# Patient Record
Sex: Female | Born: 1990 | Race: Black or African American | Hispanic: No | Marital: Single | Smoking: Never smoker
Health system: Southern US, Community
[De-identification: ages and names within clinical notes are randomized; demographics above are authoritative.]

## PROBLEM LIST (undated history)

## (undated) ENCOUNTER — Inpatient Hospital Stay (HOSPITAL_COMMUNITY): Payer: Self-pay

## (undated) DIAGNOSIS — D229 Melanocytic nevi, unspecified: Secondary | ICD-10-CM

---
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
## 2010-04-02 ENCOUNTER — Ambulatory Visit: Payer: Self-pay | Admitting: Diagnostic Radiology

## 2010-04-02 ENCOUNTER — Emergency Department (HOSPITAL_BASED_OUTPATIENT_CLINIC_OR_DEPARTMENT_OTHER): Admission: EM | Admit: 2010-04-02 | Discharge: 2010-04-02 | Payer: Self-pay | Admitting: Emergency Medicine

## 2011-10-24 ENCOUNTER — Other Ambulatory Visit: Payer: Self-pay

## 2011-10-24 ENCOUNTER — Encounter (HOSPITAL_BASED_OUTPATIENT_CLINIC_OR_DEPARTMENT_OTHER): Payer: Self-pay

## 2011-10-24 ENCOUNTER — Emergency Department (HOSPITAL_BASED_OUTPATIENT_CLINIC_OR_DEPARTMENT_OTHER)
Admission: EM | Admit: 2011-10-24 | Discharge: 2011-10-25 | Discharge status: Against Medical Advice | Payer: BC Managed Care – PPO | Attending: Emergency Medicine | Admitting: Emergency Medicine

## 2011-10-24 DIAGNOSIS — T50905A Adverse effect of unspecified drugs, medicaments and biological substances, initial encounter: Secondary | ICD-10-CM

## 2011-10-24 DIAGNOSIS — F411 Generalized anxiety disorder: Secondary | ICD-10-CM | POA: Insufficient documentation

## 2011-10-24 DIAGNOSIS — R079 Chest pain, unspecified: Secondary | ICD-10-CM | POA: Insufficient documentation

## 2011-10-24 DIAGNOSIS — E876 Hypokalemia: Secondary | ICD-10-CM

## 2011-10-24 DIAGNOSIS — T50995A Adverse effect of other drugs, medicaments and biological substances, initial encounter: Secondary | ICD-10-CM | POA: Insufficient documentation

## 2011-10-24 DIAGNOSIS — F419 Anxiety disorder, unspecified: Secondary | ICD-10-CM

## 2011-10-24 NOTE — ED Notes (Addendum)
Pt c/o CP described as pressure.  Pt very tearful and nervous.  Pt states she has had similar episodes in the past.  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  Pt states she has had anxiety in the past.  Pt states she started using OxyElite Pro recently for weight loss.

## 2011-10-24 NOTE — ED Notes (Signed)
Warm blanket per pts request

## 2011-10-25 ENCOUNTER — Emergency Department (HOSPITAL_BASED_OUTPATIENT_CLINIC_OR_DEPARTMENT_OTHER): Payer: BC Managed Care – PPO

## 2011-10-25 LAB — CBC
HCT: 36 % (ref 36.0–46.0)
MCH: 27.7 pg (ref 26.0–34.0)
MCV: 73.1 fL — ABNORMAL LOW (ref 78.0–100.0)
Platelets: 294 10*3/uL (ref 150–400)
RBC: 4.58 MIL/uL (ref 3.87–5.11)
WBC: 5.2 10*3/uL (ref 4.0–10.5)

## 2011-10-25 LAB — RAPID URINE DRUG SCREEN, HOSP PERFORMED
Amphetamines: NOT DETECTED
Cocaine: NOT DETECTED
Opiates: NOT DETECTED
Tetrahydrocannabinol: NOT DETECTED

## 2011-10-25 LAB — DIFFERENTIAL
Basophils Relative: 1 % (ref 0–1)
Lymphocytes Relative: 60 % — ABNORMAL HIGH (ref 12–46)
Neutro Abs: 1.7 10*3/uL (ref 1.7–7.7)
Neutrophils Relative %: 32 % — ABNORMAL LOW (ref 43–77)

## 2011-10-25 LAB — BASIC METABOLIC PANEL
CO2: 22 mEq/L (ref 19–32)
GFR calc non Af Amer: >90 mL/min (ref >90)
Glucose, Bld: 82 mg/dL (ref 70–99)

## 2011-10-25 MED ORDER — POTASSIUM CHLORIDE CRYS ER 20 MEQ PO TBCR
20.0000 meq | EXTENDED_RELEASE_TABLET | Freq: Once | ORAL | Status: AC
  Administered 2011-10-25: 20 meq via ORAL
  Filled 2011-10-25: qty 1

## 2011-10-25 NOTE — ED Provider Notes (Addendum)
History     CSN: [id number]  Arrival date & time 10/24/11  2212   First MD Initiated Contact with Patient 10/25/11 0000      Chief Complaint  Patient presents with  . Chest Pain    (Consider location/radiation/quality/duration/timing/severity/associated sxs/prior treatment) Patient is a 21 y.o. female presenting with chest pain. The history is provided by the patient. No language interpreter was used.  Chest Pain The chest pain began 6 - 12 hours ago. Chest pain occurs constantly. The chest pain is improving. The pain is associated with stress. At its most intense, the pain is at 10/10. The pain is currently at 10/10. The severity of the pain is severe. The quality of the pain is described as dull. The pain does not radiate. Exacerbated by: nothing. Pertinent negatives for primary symptoms include no fever, no cough, no wheezing, no nausea and no vomiting.  Pertinent negatives for associated symptoms include no claudication, no lower extremity edema, no near-syncope and no weakness. She tried nothing for the symptoms. Risk factors include no known risk factors.  Pertinent negatives for past medical history include no MI.   Patient is not eating and taking an over the counter weight loss supplement called oxyelite Pro.  Is under stress at work and works 2 jobs.  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  No OCP, no long car trips or plane trips.  No pain or swelling in the lower extremities.  PERC negative History and physical performed with tech, [place] present.   Past Medical History  Diagnosis Date  . Anxiety     Past Surgical History  Procedure Date  . Lasik     No family history on file.  History  Substance Use Topics  . Smoking status: Not on file  . Smokeless tobacco: Not on file  . Alcohol Use: Yes     OB History    Grav Para Term Preterm Abortions TAB SAB Ect Mult Living                  Review of Systems  Constitutional: Negative for fever.  HENT: Negative.   Eyes: Negative.   Respiratory: Negative for cough and wheezing.   Cardiovascular: Positive for chest pain. Negative for claudication and near-syncope.  Gastrointestinal: Negative for nausea and vomiting.  Genitourinary: Negative.   Musculoskeletal: Negative.   Neurological: Negative for weakness.  Hematological: Negative.   Psychiatric/Behavioral: Negative.     Allergies  Review of patient's allergies indicates no known allergies.  Home Medications  No current outpatient prescriptions on file.  BP 141/96  Pulse 98  Temp(Src) 98.3 F (36.8 C) (Oral)  Resp 20  Ht 5\' 4"  (1.626 m)  Wt 150 lb (68.04 kg)  BMI 25.75 kg/m2  SpO2 100%  LMP 09/18/2011  Physical Exam  Constitutional: She is oriented to person, place, and time. She appears well-developed and well-nourished.  HENT:  Head: Normocephalic and atraumatic.  Mouth/Throat: Oropharynx is clear and moist.  Eyes: Conjunctivae are normal. Pupils are equal, round, and reactive to light.  Neck: Normal range of motion. Neck supple.  Cardiovascular: Normal rate and regular rhythm.  Exam reveals no gallop and no friction rub.   Pulmonary/Chest: Effort normal and breath sounds normal. She has no wheezes. She has no rales.  Abdominal: Soft. Bowel  sounds are normal. There is no tenderness. There is no rebound and no guarding.  Musculoskeletal: Normal range of motion. She exhibits no edema and no tenderness.  Neurological: She is alert and oriented to person, place, and time. She has normal reflexes.  Skin: Skin is warm and dry. She is not diaphoretic.  Psychiatric: Her mood appears anxious.    ED Course  Procedures (including critical care time)   Labs Reviewed  PREGNANCY, URINE  URINE RAPID DRUG SCREEN (HOSP PERFORMED)  CBC  DIFFERENTIAL  BASIC METABOLIC  PANEL  TROPONIN I   No results found.   No diagnosis found.   Date: 10/25/2011  Rate: 88  Rhythm: sinus arrhythmia  QRS Axis: normal  Intervals: normal  ST/T Wave abnormalities: normal  Conduction Disutrbances:none  Narrative Interpretation:   Old EKG Reviewed: none available   MDM  Patient is very anxious, repeatedly refuses to give urine sample this was witnessed by [facility], tech who was present to assist patient to the restroom. EDP explained urine was necessary to exclude pregnancy prior to chest xrays. Patient was asked repeatedly and does not want chest xray or other intervention interventions. EDP suspect this episode of chest pain is a combination of stress and medication effect.  Patient informed that she should stop taking the diet supplement immediately as there are potentially dangerous side effects. This information was conveyed with patient's nurse Jasmine December present.  Patient verbalized understanding and stated she would discontinue same.      Very low risk for cardiac disease with negative EKG, negative troponin in the setting of continued pain.  PERC negative, symptoms not cw with PE.  very low risk for PE.   Patient refusing chest XRay.  Patient will need to sign our AMA understanding that the risks of signing AMA are but are not limited to death, heart attack, collapsed lung pneumonia, and prolonged morbidity.  Patient has decision making capacity to refuse and is welcomed to return at any time        Kristapher [place] Smitty Cords, MD 10/25/11 [phone]

## 2011-10-25 NOTE — ED Notes (Signed)
States she does not want to see a young doctor.

## 2011-10-25 NOTE — ED Notes (Signed)
Pt does not want to have the cxr that was ordered. Dr Nicanor Alcon spoke to pt in detail about the risk of leaving without having the cxr completed. She understands the risk and wants to go home. States she will stop taking the [facility] diet supplement.

## 2011-11-09 ENCOUNTER — Encounter (HOSPITAL_COMMUNITY): Payer: Self-pay | Admitting: Emergency Medicine

## 2011-11-09 ENCOUNTER — Emergency Department (HOSPITAL_COMMUNITY)
Admission: EM | Admit: 2011-11-09 | Discharge: 2011-11-09 | Disposition: A | Discharge status: Home / Self Care | Payer: No Typology Code available for payment source | Attending: Emergency Medicine | Admitting: Emergency Medicine

## 2011-11-09 ENCOUNTER — Emergency Department (HOSPITAL_COMMUNITY): Payer: No Typology Code available for payment source

## 2011-11-09 DIAGNOSIS — S6000XA Contusion of unspecified finger without damage to nail, initial encounter: Secondary | ICD-10-CM

## 2011-11-09 DIAGNOSIS — Y92410 Unspecified street and highway as the place of occurrence of the external cause: Secondary | ICD-10-CM | POA: Insufficient documentation

## 2011-11-09 DIAGNOSIS — S139XXA Sprain of joints and ligaments of unspecified parts of neck, initial encounter: Secondary | ICD-10-CM | POA: Insufficient documentation

## 2011-11-09 DIAGNOSIS — S161XXA Strain of muscle, fascia and tendon at neck level, initial encounter: Secondary | ICD-10-CM

## 2011-11-09 MED ORDER — CYCLOBENZAPRINE HCL 10 MG PO TABS
10.0000 mg | ORAL_TABLET | Freq: Three times a day (TID) | ORAL | Status: AC | PRN
Start: 2011-11-09 — End: 2011-11-19

## 2011-11-09 MED ORDER — IBUPROFEN 800 MG PO TABS: 800.0000 mg | ORAL_TABLET | Freq: Three times a day (TID) | ORAL | Status: AC | PRN

## 2011-11-09 MED ORDER — HYDROCODONE-ACETAMINOPHEN 5-325 MG PO TABS
1.0000 | ORAL_TABLET | Freq: Once | ORAL | Status: AC
  Administered 2011-11-09: 1 via ORAL
  Filled 2011-11-09: qty 1

## 2011-11-09 MED ORDER — HYDROCODONE-ACETAMINOPHEN 5-325 MG PO TABS: 1.0000 | ORAL_TABLET | Freq: Four times a day (QID) | ORAL | Status: AC | PRN

## 2011-11-09 MED ORDER — IBUPROFEN 800 MG PO TABS
800.0000 mg | ORAL_TABLET | Freq: Once | ORAL | Status: AC
Start: 2011-11-09 — End: 2011-11-09
  Administered 2011-11-09: 800 mg via ORAL
  Filled 2011-11-09: qty 1
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
## 2011-11-09 NOTE — Discharge Instructions (Signed)
Your x-rays were normal here today.  Return here for any worsening in her condition.  Use ice and heat on her neck and back.
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
## 2011-11-10 NOTE — ED Provider Notes (Signed)
Medical screening examination/treatment/procedure(s) were performed by non-physician practitioner and as supervising physician I was immediately available for consultation/collaboration.  Doug Sou, MD 11/10/11 [phone]
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
## 2012-07-16 ENCOUNTER — Ambulatory Visit (INDEPENDENT_AMBULATORY_CARE_PROVIDER_SITE_OTHER): Payer: BC Managed Care – PPO | Admitting: Obstetrics and Gynecology

## 2012-07-16 ENCOUNTER — Encounter: Payer: Self-pay | Admitting: Obstetrics and Gynecology

## 2012-07-16 VITALS — BP 90/62 | Ht 66.0 in | Wt 187.0 lb

## 2012-07-16 DIAGNOSIS — Z130 Encounter for screening for diseases of the blood and blood-forming organs and certain disorders involving the immune mechanism: Secondary | ICD-10-CM

## 2012-07-16 DIAGNOSIS — Z124 Encounter for screening for malignant neoplasm of cervix: Secondary | ICD-10-CM

## 2012-07-16 DIAGNOSIS — Z01419 Encounter for gynecological examination (general) (routine) without abnormal findings: Secondary | ICD-10-CM

## 2012-07-16 NOTE — Progress Notes (Signed)
Subjective:    Caitlin Hall is a 21 y.o. female, G0, who presents for an annual exam.  The patient reports:no complaints   Contraception:no method, natural family planning (NFP)ACOG Pre-Conception and PNV samples given to pt. Pt wants to be tested for SST due to partner with positive SST.   Last mammogram: not applicable    Last pap:2008-[facility]NL  GC/Chlamydia cultures offered: declined Pt had GC/CT last month @ another facility=both negative HIV/RPR/HbsAg offered:  declined HSV 1 and 2 glycoprotein offered: declined  Menstrual cycle regular and monthly: Yes Menstrual flow normal: Yes  Urinary symptoms: none Normal bowel movements: No:  Reports abuse at home: No:       History   Social History  . Marital Status: Single    Spouse Name: N/A    Number of Children: N/A  . Years of Education: N/A   Social History Main Topics  . Smoking status: Never Smoker   . Smokeless tobacco: Never Used  . Alcohol Use: No  . Drug Use: No  . Sexually Active: None   Other Topics Concern  . None   Social History Narrative  . None    Menstrual cycle:   LMP: Patient's last menstrual period was 07/07/2012.           Cycle: every 28-30days  The following portions of the patient's history were reviewed and updated as appropriate: allergies, current medications, past family history, past medical history, past social history, past surgical history and problem list.  Review of Systems Pertinent items are noted in HPI. Breast:Negative for breast lump,nipple discharge or nipple retraction Gastrointestinal: Negative for abdominal pain, change in bowel habits or rectal bleeding Urinary:negative   Objective:    Ht 5\' 6"  (1.676 m)  Wt 187 lb (84.823 kg)  BMI 30.18 kg/m2  LMP 07/07/2012    Weight:  Wt Readings from Last 1 Encounters:  07/16/12 187 lb (84.823 kg)          BMI: Body mass index is 30.18 kg/(m^2).  General Appearance: Alert, appropriate appearance for age. No acute  distress HEENT: Grossly normal Neck / Thyroid: Supple, no masses, nodes or enlargement Lungs: clear to auscultation bilaterally Back: No CVA tenderness Breast Exam: No masses or nodes.No dimpling, nipple retraction or discharge. Cardiovascular: Regular rate and rhythm. S1, S2, no murmur Gastrointestinal: Soft, non-tender, no masses or organomegaly Pelvic Exam: Vulva and vagina appear normal. Bimanual exam reveals normal uterus and adnexa. Rectovaginal: not indicated Lymphatic Exam: Non-palpable nodes in neck, clavicular, axillary, or inguinal regions Skin: no rash or abnormalities Neurologic: Normal gait and speech, no tremor  Psychiatric: Alert and oriented, appropriate affect.     Assessment:    Normal gyn exam    Plan:    pap smear return annually or prn STD screening: declined Contraception:no method Fertility/Conception Discussed PreNatal Vitamins and Folic Acid discussed  Weight Management discussed     Silverio Lay MD

## 2012-07-19 LAB — PAP IG W/ RFLX HPV ASCU

## 2012-07-20 LAB — HEMOGLOBINOPATHY EVALUATION
Hemoglobin Other: 35.2 % — ABNORMAL HIGH
Hgb A2 Quant: 2.6 % (ref 2.2–3.2)

## 2012-07-23 LAB — HGB ELECTROPHORESIS REFLEXED REPORT
Hemoglobin A - HGBRFX: 58.1 % — ABNORMAL LOW (ref >96.0)
Hemoglobin F - HGBRFX: 0 % (ref <2.0)
Sickle Solubility Test - HGBRFX: NEGATIVE

## 2012-07-24 NOTE — Progress Notes (Signed)
Quick Note:  Please call patient and recommend FOB be tested for Sickle cell if his status is unknown. 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

## 2012-07-26 ENCOUNTER — Telehealth: Payer: Self-pay

## 2012-07-26 NOTE — Telephone Encounter (Signed)
Per visit on 07/16/12 pt stated that FOB was + for SST   Darien Ramus, CMA

## 2012-07-26 NOTE — Telephone Encounter (Signed)
Message copied by Darien Ramus on Mon Jul 26, 2012 10:53 AM ------      Message from: Larwance Rote      Created: Mon Jul 26, 2012 10:36 AM                   ----- Message -----         From: Esmeralda Arthur, MD         Sent: 07/24/2012   1:49 PM           To: Butler Denmark, CMA            Please call patient and recommend FOB be tested for Sickle cell if his status is unknown. 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

## 2012-07-27 ENCOUNTER — Encounter: Payer: Self-pay | Admitting: Obstetrics and Gynecology

## 2012-07-27 DIAGNOSIS — D582 Other hemoglobinopathies: Secondary | ICD-10-CM | POA: Insufficient documentation

## 2012-07-27 NOTE — Telephone Encounter (Signed)
Please refer to hematology for genetic counseling: mother is Hgb C heterozygote and FOB is SST

## 2012-07-29 NOTE — Telephone Encounter (Signed)
Faxing records to Dr. Cyndie Chime for genetic counseling. Office states that they have a nurse that does this type of apt.  Pt aware of office contacting her with apt  Darien Ramus, CMA

## 2012-07-30 ENCOUNTER — Telehealth: Payer: Self-pay | Admitting: Genetic Counselor

## 2012-07-30 ENCOUNTER — Telehealth: Payer: Self-pay | Admitting: Obstetrics and Gynecology

## 2012-07-30 NOTE — Telephone Encounter (Signed)
S/W PT IN REF TO NP [facility] ON 10/07/12@10 :00 REFERRING DR Estanislado Pandy MAILED NP APPT.

## 2012-07-30 NOTE — Telephone Encounter (Signed)
TC to pt had questions regarding referral. Advised pt of hemoglobin c heterozygote and reason for referral.  Pt voiced understanding  Darien Ramus, CMA

## 2012-10-07 ENCOUNTER — Ambulatory Visit: Payer: BC Managed Care – PPO | Admitting: Genetic Counselor

## 2012-10-07 ENCOUNTER — Telehealth: Payer: Self-pay | Admitting: Genetic Counselor

## 2012-10-07 ENCOUNTER — Other Ambulatory Visit: Payer: BC Managed Care – PPO | Admitting: Lab

## 2012-10-07 ENCOUNTER — Encounter: Payer: BC Managed Care – PPO | Admitting: Genetic Counselor

## 2012-10-07 DIAGNOSIS — D582 Other hemoglobinopathies: Secondary | ICD-10-CM

## 2012-10-07 NOTE — Telephone Encounter (Signed)
Per Clydie Braun to put her on for 3.

## 2012-10-11 NOTE — Progress Notes (Signed)
Patient was mistakenly scheduled in my clinic to discuss a recent HB electorphoresis.  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  Briefly discussed inheritance patterns and potential outcome for offspring and referred patient and her fiance to Dr. Marlena Clipper.

## 2012-11-24 ENCOUNTER — Encounter: Payer: Self-pay | Admitting: Oncology

## 2012-11-24 ENCOUNTER — Encounter: Payer: BC Managed Care – PPO | Admitting: Oncology

## 2012-11-24 ENCOUNTER — Other Ambulatory Visit: Payer: BC Managed Care – PPO | Admitting: Lab

## 2012-11-24 ENCOUNTER — Ambulatory Visit: Payer: BC Managed Care – PPO

## 2012-11-24 NOTE — Progress Notes (Signed)
Patient and her fianc referred to our office for a discussion about the recent finding of Sickle/C. disease. They failed to report for the visit.

## 2013-03-18 LAB — OB RESULTS CONSOLE ABO/RH: RH Type: POSITIVE

## 2013-03-18 LAB — OB RESULTS CONSOLE GC/CHLAMYDIA
Chlamydia: NEGATIVE
Gonorrhea: NEGATIVE

## 2013-03-18 LAB — OB RESULTS CONSOLE GBS: GBS: POSITIVE

## 2013-03-18 LAB — OB RESULTS CONSOLE ANTIBODY SCREEN: ANTIBODY SCREEN: NEGATIVE

## 2013-03-18 LAB — OB RESULTS CONSOLE RUBELLA ANTIBODY, IGM: Rubella: IMMUNE

## 2013-03-18 LAB — OB RESULTS CONSOLE HIV ANTIBODY (ROUTINE TESTING): HIV: NONREACTIVE

## 2013-03-18 LAB — OB RESULTS CONSOLE RPR: RPR: NONREACTIVE

## 2013-03-18 LAB — OB RESULTS CONSOLE HEPATITIS B SURFACE ANTIGEN: HEP B S AG: NEGATIVE

## 2013-06-01 ENCOUNTER — Encounter (HOSPITAL_COMMUNITY): Payer: Self-pay

## 2013-06-01 ENCOUNTER — Other Ambulatory Visit: Payer: Self-pay

## 2013-06-01 ENCOUNTER — Inpatient Hospital Stay (HOSPITAL_COMMUNITY)
Admission: AD | Admit: 2013-06-01 | Discharge: 2013-06-01 | Disposition: A | Discharge status: Home / Self Care | Payer: BC Managed Care – PPO | Source: Ambulatory Visit | Attending: Obstetrics and Gynecology | Admitting: Obstetrics and Gynecology

## 2013-06-01 DIAGNOSIS — R079 Chest pain, unspecified: Secondary | ICD-10-CM | POA: Insufficient documentation

## 2013-06-01 DIAGNOSIS — O99891 Other specified diseases and conditions complicating pregnancy: Secondary | ICD-10-CM | POA: Diagnosis not present

## 2013-06-01 DIAGNOSIS — M940 Chondrocostal junction syndrome [Tietze]: Secondary | ICD-10-CM | POA: Insufficient documentation

## 2013-06-01 LAB — CBC WITH DIFFERENTIAL/PLATELET
Basophils Relative: 0 % (ref 0–1)
Eosinophils Relative: 2 % (ref 0–5)
HCT: 32 % — ABNORMAL LOW (ref 36.0–46.0)
Hemoglobin: 12.3 g/dL (ref 12.0–15.0)
Lymphocytes Relative: 21 % (ref 12–46)
MCH: 28.8 pg (ref 26.0–34.0)
MCV: 74.9 fL — ABNORMAL LOW (ref 78.0–100.0)
Monocytes Relative: 8 % (ref 3–12)
Neutro Abs: 4.6 10*3/uL (ref 1.7–7.7)
WBC: 6.6 10*3/uL (ref 4.0–10.5)

## 2013-06-01 LAB — COMPREHENSIVE METABOLIC PANEL
ALT: 36 U/L — ABNORMAL HIGH (ref 0–35)
BUN: 6 mg/dL (ref 6–23)
CO2: 22 mEq/L (ref 19–32)
Calcium: 9.5 mg/dL (ref 8.4–10.5)
Creatinine, Ser: 0.54 mg/dL (ref 0.50–1.10)
GFR calc non Af Amer: >90 mL/min (ref >90)
Potassium: 3.4 mEq/L — ABNORMAL LOW (ref 3.5–5.1)
Sodium: 136 mEq/L (ref 135–145)
Total Bilirubin: 0.2 mg/dL — ABNORMAL LOW (ref 0.3–1.2)

## 2013-06-01 MED ORDER — PANTOPRAZOLE SODIUM 20 MG PO TBEC
20.0000 mg | DELAYED_RELEASE_TABLET | Freq: Once | ORAL | Status: AC
Start: 2013-06-01 — End: 2013-06-01
  Administered 2013-06-01: 20 mg via ORAL
  Filled 2013-06-01: qty 1

## 2013-06-01 MED ORDER — ALUM & MAG HYDROXIDE-SIMETH 200-200-20 MG/5ML PO SUSP
30.0000 mL | Freq: Once | ORAL | Status: AC
  Administered 2013-06-01: 30 mL via ORAL
  Filled 2013-06-01: qty 30

## 2013-06-01 NOTE — MAU Provider Note (Signed)
History     CSN: [id number]  Arrival date and time: 06/01/13 1415   First Provider Initiated Contact with Patient 06/01/13 1534      Chief Complaint  Patient presents with  . Chest Pain   Chest Pain     Caitlin Hall is a 22 y.o. G1P0000 at [redacted]w[redacted]d who presents today with chest pain. She states the pain is along the sternum and is worse when she makes a deep inhalation. She has a hx of GERD, however she states that this pain is not the same as the pain she has felt with that. She denies any difficulty breathing.   Past Medical History  Diagnosis Date  . Acid reflux     Past Surgical History  Procedure Laterality Date  . Eye surgery      Family History  Problem Relation Age of Onset  . Other Mother     low estrogen   . Other Sister 45    low estrogen    History  Substance Use Topics  . Smoking status: Never Smoker   . Smokeless tobacco: Never Used  . Alcohol Use: No    Allergies: No Known Allergies  Prescriptions prior to admission  Medication Sig Dispense Refill  . acetaminophen (TYLENOL) 325 MG tablet Take 325 mg by mouth every 6 (six) hours as needed for pain.      . Prenatal Vit-Fe Fumarate-FA (PRENATAL MULTIVITAMIN) TABS tablet Take 1 tablet by mouth daily at 12 noon.        Review of Systems  Cardiovascular: Positive for chest pain.   Physical Exam   Blood pressure 114/74, pulse 100, temperature 98.7 F (37.1 C), temperature source Oral, resp. rate 18, height 5\' 5"  (1.651 m), weight 86.183 kg (190 lb), last menstrual period 07/07/2012, SpO2 100.00%.  Physical Exam  Nursing note and vitals reviewed. Constitutional: She is oriented to person, place, and time. She appears well-developed and well-nourished. No distress.  Cardiovascular: Normal rate, regular rhythm and normal heart sounds.   No murmur heard. Respiratory: Effort normal. She has no wheezes. She exhibits no tenderness.  GI: Soft. There is no tenderness.  Neurological: She is alert  and oriented to person, place, and time.  Skin: Skin is warm and dry.  Psychiatric: She has a normal mood and affect.    MAU Course  Procedures  Results for orders placed during the hospital encounter of 06/01/13 (from the past 24 hour(s))  CBC WITH DIFFERENTIAL     Status: Abnormal   Collection Time    06/01/13  3:13 PM      Result Value Range   WBC 6.6  4.0 - 10.5 K/uL   RBC 4.27  3.87 - 5.11 MIL/uL   Hemoglobin 12.3  12.0 - 15.0 g/dL   HCT 69.6 (*) 29.5 - 28.4 %   MCV 74.9 (*) 78.0 - 100.0 fL   MCH 28.8  26.0 - 34.0 pg   MCHC 38.4 (*) 30.0 - 36.0 g/dL   RDW 13.2  44.0 - 10.2 %   Platelets 257  150 - 400 K/uL   Neutrophils Relative % 69  43 - 77 %   Lymphocytes Relative 21  12 - 46 %   Monocytes Relative 8  3 - 12 %   Eosinophils Relative 2  0 - 5 %   Basophils Relative 0  0 - 1 %   Neutro Abs 4.6  1.7 - 7.7 K/uL   Lymphs Abs 1.4  0.7 - 4.0 K/uL  Monocytes Absolute 0.5  0.1 - 1.0 K/uL   Eosinophils Absolute 0.1  0.0 - 0.7 K/uL   Basophils Absolute 0.0  0.0 - 0.1 K/uL   Smear Review MORPHOLOGY UNREMARKABLE    COMPREHENSIVE METABOLIC PANEL     Status: Abnormal   Collection Time    06/01/13  3:13 PM      Result Value Range   Sodium 136  135 - 145 mEq/L   Potassium 3.4 (*) 3.5 - 5.1 mEq/L   Chloride 102  96 - 112 mEq/L   CO2 22  19 - 32 mEq/L   Glucose, Bld 95  70 - 99 mg/dL   BUN 6  6 - 23 mg/dL   Creatinine, Ser 4.54  0.50 - 1.10 mg/dL   Calcium 9.5  8.4 - 09.8 mg/dL   Total Protein 6.4  6.0 - 8.3 g/dL   Albumin 3.4 (*) 3.5 - 5.2 g/dL   AST 26  0 - 37 U/L   ALT 36 (*) 0 - 35 U/L   Alkaline Phosphatase 43  39 - 117 U/L   Total Bilirubin 0.2 (*) 0.3 - 1.2 mg/dL   GFR calc non Af Amer >90  >90 mL/min   GFR calc Af Amer >90  >90 mL/min    1634: Patient reports that pain is improved.  1640; D/W Dr. Normand Sloop. Patient is ok for DC home. Will plan to alternate tylenol and ibuprofen for 24 hours, and call office if no improvement in 48 hours.   Assessment and Plan    1. Costochondritis    Alternate tylenol and ibuprofen for the next 24 hours  Call the office if pain is not improved in 48 hours   Tawnya Crook 06/01/2013, 3:57 PM

## 2013-06-01 NOTE — Progress Notes (Signed)
Heather hogan cnm notified that dr Normand Sloop wants a advanced practice provider to evaluate patient.

## 2013-06-01 NOTE — MAU Note (Signed)
Sharp pain in chest, started about an hour ago- worse when inhales, epigastric area; "feels like it was going to cave in", thought bra might be too tight, loosened it - no relief.  Had some chest pain before- took antacid and it went away.

## 2013-06-01 NOTE — Progress Notes (Signed)
Dr Normand Sloop notified of patient, her presenting complains, vital signs. Order to have a MAU advanced practice provider see patient, order cbc with diff, cmet, patient may have protonix 20mg  once if she have not taken it today and maalox liquid 30ml once.

## 2013-06-07 ENCOUNTER — Other Ambulatory Visit: Payer: Self-pay | Admitting: Obstetrics and Gynecology

## 2013-06-07 DIAGNOSIS — O283 Abnormal ultrasonic finding on antenatal screening of mother: Secondary | ICD-10-CM

## 2013-06-08 ENCOUNTER — Encounter (HOSPITAL_COMMUNITY): Payer: Self-pay | Admitting: Obstetrics and Gynecology

## 2013-06-21 ENCOUNTER — Other Ambulatory Visit (HOSPITAL_COMMUNITY): Payer: BC Managed Care – PPO

## 2013-06-21 ENCOUNTER — Ambulatory Visit (HOSPITAL_COMMUNITY): Payer: No Typology Code available for payment source

## 2013-06-21 ENCOUNTER — Ambulatory Visit (HOSPITAL_COMMUNITY): Payer: BC Managed Care – PPO

## 2013-06-21 ENCOUNTER — Encounter (HOSPITAL_COMMUNITY): Payer: BC Managed Care – PPO

## 2013-06-24 ENCOUNTER — Ambulatory Visit (HOSPITAL_COMMUNITY)
Admission: RE | Admit: 2013-06-24 | Discharge: 2013-06-24 | Disposition: A | Discharge status: Home / Self Care | Payer: BC Managed Care – PPO | Source: Ambulatory Visit | Attending: Obstetrics and Gynecology | Admitting: Obstetrics and Gynecology

## 2013-06-24 ENCOUNTER — Encounter (HOSPITAL_COMMUNITY): Payer: Self-pay

## 2013-06-24 DIAGNOSIS — O3410 Maternal care for benign tumor of corpus uteri, unspecified trimester: Secondary | ICD-10-CM | POA: Insufficient documentation

## 2013-06-24 DIAGNOSIS — O358XX0 Maternal care for other (suspected) fetal abnormality and damage, not applicable or unspecified: Secondary | ICD-10-CM | POA: Insufficient documentation

## 2013-06-24 DIAGNOSIS — O352XX0 Maternal care for (suspected) hereditary disease in fetus, not applicable or unspecified: Secondary | ICD-10-CM | POA: Insufficient documentation

## 2013-06-24 DIAGNOSIS — Z1389 Encounter for screening for other disorder: Secondary | ICD-10-CM | POA: Insufficient documentation

## 2013-06-24 DIAGNOSIS — O283 Abnormal ultrasonic finding on antenatal screening of mother: Secondary | ICD-10-CM

## 2013-06-24 DIAGNOSIS — Z363 Encounter for antenatal screening for malformations: Secondary | ICD-10-CM | POA: Insufficient documentation

## 2013-07-14 ENCOUNTER — Other Ambulatory Visit: Payer: Self-pay

## 2013-08-25 ENCOUNTER — Encounter (HOSPITAL_COMMUNITY): Payer: Self-pay | Admitting: General Practice

## 2013-08-25 ENCOUNTER — Inpatient Hospital Stay (HOSPITAL_COMMUNITY)
Admission: AD | Admit: 2013-08-25 | Discharge: 2013-08-26 | Disposition: A | Discharge status: Home / Self Care | Payer: Medicaid Other | Source: Ambulatory Visit | Attending: Obstetrics and Gynecology | Admitting: Obstetrics and Gynecology

## 2013-08-25 DIAGNOSIS — O4700 False labor before 37 completed weeks of gestation, unspecified trimester: Secondary | ICD-10-CM | POA: Insufficient documentation

## 2013-08-25 LAB — URINE MICROSCOPIC-ADD ON

## 2013-08-25 LAB — URINALYSIS, ROUTINE W REFLEX MICROSCOPIC
Bilirubin Urine: NEGATIVE
Glucose, UA: NEGATIVE mg/dL
Ketones, ur: NEGATIVE mg/dL
LEUKOCYTES UA: NEGATIVE
NITRITE: NEGATIVE
PH: 6.5 (ref 5.0–8.0)
Protein, ur: NEGATIVE mg/dL
SPECIFIC GRAVITY, URINE: 1.02 (ref 1.005–1.030)
Urobilinogen, UA: 1 mg/dL (ref 0.0–1.0)

## 2013-08-25 NOTE — MAU Provider Note (Signed)
History   22yo, G1P0 at [redacted]w[redacted]d presents for UCs every 30-45 min.  Described as starting in her back and wrapping around to her lower abdomen.  Denies VB, LOF, recent fever, resp or GI c/o's, UTI or PIH s/s. GFM.   Chief Complaint  Patient presents with  . Contractions   HPI  OB History   Grav Para Term Preterm Abortions TAB SAB Ect Mult Living   1 0 0 0 0 0 0 0 0 0       Past Medical History  Diagnosis Date  . Acid reflux     Past Surgical History  Procedure Laterality Date  . Eye surgery      Family History  Problem Relation Age of Onset  . Other Mother     low estrogen   . Other Sister 55    low estrogen    History  Substance Use Topics  . Smoking status: Never Smoker   . Smokeless tobacco: Never Used  . Alcohol Use: No    Allergies: No Known Allergies  Prescriptions prior to admission  Medication Sig Dispense Refill  . acetaminophen (TYLENOL) 325 MG tablet Take 325 mg by mouth every 6 (six) hours as needed for pain.      . Prenatal Vit-Fe Fumarate-FA (PRENATAL MULTIVITAMIN) TABS tablet Take 1 tablet by mouth daily at 12 noon.        ROS ROS: see HPI above, all other systems are negative  Physical Exam   Blood pressure 142/84, pulse 126, temperature 98.4 F (36.9 C), temperature source Oral, resp. rate 18, height 5\' 6"  (1.676 m), weight 204 lb (92.534 kg), last menstrual period 01/25/2013.  Physical Exam Chest: Clear Heart: RRR Abdomen: gravid, NT Extremities: WNL  Dilation: Closed Effacement (%): Thick Cervical Position: Middle Station: Ballotable  FHT: Reactive NST UCs: rare with some uterine irritability  Results for orders placed during the hospital encounter of 08/25/13 (from the past 24 hour(s))  URINALYSIS, ROUTINE W REFLEX MICROSCOPIC     Status: Abnormal   Collection Time    08/25/13 10:15 PM      Result Value Range   Color, Urine YELLOW  YELLOW   APPearance CLEAR  CLEAR   Specific Gravity, Urine 1.020  1.005 - 1.030   pH 6.5  5.0  - 8.0   Glucose, UA NEGATIVE  NEGATIVE mg/dL   Hgb urine dipstick SMALL (*) NEGATIVE   Bilirubin Urine NEGATIVE  NEGATIVE   Ketones, ur NEGATIVE  NEGATIVE mg/dL   Protein, ur NEGATIVE  NEGATIVE mg/dL   Urobilinogen, UA 1.0  0.0 - 1.0 mg/dL   Nitrite NEGATIVE  NEGATIVE   Leukocytes, UA NEGATIVE  NEGATIVE  URINE MICROSCOPIC-ADD ON     Status: None   Collection Time    08/25/13 10:15 PM      Result Value Range   Squamous Epithelial / LPF RARE  RARE   WBC, UA 0-2  <3 WBC/hpf   RBC / HPF 0-2  <3 RBC/hpf   Bacteria, UA RARE  RARE  WET PREP, GENITAL     Status: Abnormal   Collection Time    08/26/13 12:10 AM      Result Value Range   Yeast Wet Prep HPF POC NONE SEEN  NONE SEEN   Trich, Wet Prep NONE SEEN  NONE SEEN   Clue Cells Wet Prep HPF POC NONE SEEN  NONE SEEN   WBC, Wet Prep HPF POC FEW (*) NONE SEEN  FETAL FIBRONECTIN     Status:  None   Collection Time    08/26/13 12:10 AM      Result Value Range   Fetal Fibronectin NEGATIVE  NEGATIVE     ED Course  IUP at [redacted]w[redacted]d PTL evaluation  Wet Prep FFN GC/CT  No cervical change D/c home with precautions F/u 2/3 with an already scheduled ROB   Linda Hedges CNM, MSN 08/25/2013 11:50 PM

## 2013-08-25 NOTE — MAU Note (Signed)
Pt presents to MAU with c/o feeling uterine contractions once every 30 to 45 minutes.

## 2013-08-25 NOTE — MAU Note (Signed)
Pt G1 at 30.2wks having contractions every 87min. Denies bleeding, leaking or discharge. No problems with pregnancy.

## 2013-08-26 LAB — GC/CHLAMYDIA PROBE AMP
CT PROBE, AMP APTIMA: NEGATIVE
GC PROBE AMP APTIMA: NEGATIVE

## 2013-08-26 LAB — FETAL FIBRONECTIN: Fetal Fibronectin: NEGATIVE

## 2013-08-26 LAB — WET PREP, GENITAL
CLUE CELLS WET PREP: NONE SEEN
Trich, Wet Prep: NONE SEEN
Yeast Wet Prep HPF POC: NONE SEEN

## 2013-08-26 NOTE — Discharge Instructions (Signed)
Preterm Labor Information Preterm labor is when labor starts at less than 37 weeks of pregnancy. The normal length of a pregnancy is 39 to 41 weeks. CAUSES Often, there is no identifiable underlying cause as to why a woman goes into preterm labor. One of the most common known causes of preterm labor is infection. Infections of the uterus, cervix, vagina, amniotic sac, bladder, kidney, or even the lungs (pneumonia) can cause labor to start. Other suspected causes of preterm labor include:   Urogenital infections, such as yeast infections and bacterial vaginosis.   Uterine abnormalities (uterine shape, uterine septum, fibroids, or bleeding from the placenta).   A cervix that has been operated on (it may fail to stay closed).   Malformations in the fetus.   Multiple gestations (twins, triplets, and so on).   Breakage of the amniotic sac.  RISK FACTORS  Having a previous history of preterm labor.   Having premature rupture of membranes (PROM).   Having a placenta that covers the opening of the cervix (placenta previa).   Having a placenta that separates from the uterus (placental abruption).   Having a cervix that is too weak to hold the fetus in the uterus (incompetent cervix).   Having too much fluid in the amniotic sac (polyhydramnios).   Taking illegal drugs or smoking while pregnant.   Not gaining enough weight while pregnant.   Being younger than 69 and older than 25 years old.   Having a low socioeconomic status.   Being African American. SYMPTOMS Signs and symptoms of preterm labor include:   Menstrual-like cramps, abdominal pain, or back pain.  Uterine contractions that are regular, as frequent as six in an hour, regardless of their intensity (may be mild or painful).  Contractions that start on the top of the uterus and spread down to the lower abdomen and back.   A sense of increased pelvic pressure.   A watery or bloody mucus discharge that  comes from the vagina.  TREATMENT Depending on the length of the pregnancy and other circumstances, your health care provider may suggest bed rest. If necessary, there are medicines that can be given to stop contractions and to mature the fetal lungs. If labor happens before 34 weeks of pregnancy, a prolonged hospital stay may be recommended. Treatment depends on the condition of both you and the fetus.  WHAT SHOULD YOU DO IF YOU THINK YOU ARE IN PRETERM LABOR? Call your health care provider right away. You will need to go to the hospital to get checked immediately. HOW CAN YOU PREVENT PRETERM LABOR IN FUTURE PREGNANCIES? You should:   Stop smoking if you smoke.  Maintain healthy weight gain and avoid chemicals and drugs that are not necessary.  Be watchful for any type of infection.  Inform your health care provider if you have a known history of preterm labor. Document Released: 10/11/2003 Document Revised: 03/23/2013 Document Reviewed: 08/23/2012 [facility] Patient Information 2014 [place], [place].  Braxton Hicks Contractions Pregnancy is commonly associated with contractions of the uterus throughout the pregnancy. Towards the end of pregnancy (32 to 34 weeks), these contractions [facility] Ishmael Holter) can develop more often and may become more forceful. This is not true labor because these contractions do not result in opening (dilatation) and thinning of the cervix. They are sometimes difficult to tell apart from true labor because these contractions can be forceful and people have different pain tolerances. You should not feel embarrassed if you go to the hospital with false labor. Sometimes,  the only way to tell if you are in true labor is for your caregiver to follow the changes in the cervix. How to tell the difference between true and false labor:  False labor.  The contractions of false labor are usually shorter, irregular and not as hard as those of true labor.  They are often felt in  the front of the lower abdomen and in the groin.  They may leave with walking around or changing positions while lying down.  They get weaker and are shorter lasting as time goes on.  These contractions are usually irregular.  They do not usually become progressively stronger, regular and closer together as with true labor.  True labor.  Contractions in true labor last 30 to 70 seconds, become very regular, usually become more intense, and increase in frequency.  They do not go away with walking.  The discomfort is usually felt in the top of the uterus and spreads to the lower abdomen and low back.  True labor can be determined by your caregiver with an exam. This will show that the cervix is dilating and getting thinner. If there are no prenatal problems or other health problems associated with the pregnancy, it is completely safe to be sent home with false labor and await the onset of true labor. HOME CARE INSTRUCTIONS   Keep up with your usual exercises and instructions.  Take medications as directed.  Keep your regular prenatal appointment.  Eat and drink lightly if you think you are going into labor.  If BH contractions are making you uncomfortable:  Change your activity position from lying down or resting to walking/walking to resting.  Sit and rest in a tub of warm water.  Drink 2 to 3 glasses of water. Dehydration may cause B-H contractions.  Do slow and deep breathing several times an hour. SEEK IMMEDIATE MEDICAL CARE IF:   Your contractions continue to become stronger, more regular, and closer together.  You have a gushing, burst or leaking of fluid from the vagina.  An oral temperature above 102 F (38.9 C) develops.  You have passage of blood-tinged mucus.  You develop vaginal bleeding.  You develop continuous belly (abdominal) pain.  You have low back pain that you never had before.  You feel the baby's head pushing down causing pelvic  pressure.  The baby is not moving as much as it used to. Document Released: 07/21/2005 Document Revised: 10/13/2011 Document Reviewed: 05/02/2013 [facility] Patient Information 2014 [place], [place].

## 2013-10-19 ENCOUNTER — Encounter (HOSPITAL_COMMUNITY): Payer: Self-pay | Admitting: *Deleted

## 2013-10-19 ENCOUNTER — Inpatient Hospital Stay (HOSPITAL_COMMUNITY)
Admission: AD | Admit: 2013-10-19 | Discharge: 2013-10-19 | Disposition: A | Discharge status: Home / Self Care | Payer: Medicaid Other | Source: Ambulatory Visit | Attending: Obstetrics and Gynecology | Admitting: Obstetrics and Gynecology

## 2013-10-19 DIAGNOSIS — O479 False labor, unspecified: Secondary | ICD-10-CM | POA: Insufficient documentation

## 2013-10-19 NOTE — MAU Note (Signed)
C/o ucs since 1030 today; ucs have gotten progressively stronger;

## 2013-10-19 NOTE — MAU Provider Note (Signed)
History   22yo, G1P0 at [redacted]w[redacted]d presents to MAU with c/o UCs that started about 1030 and have become progressively worse throughout the day.  Denies VB, LOF, recent fever, resp or GI c/o's, UTI or PIH s/s. GFM.  Chief Complaint  Patient presents with  . Labor Eval   HPI  OB History   Grav Para Term Preterm Abortions TAB SAB Ect Mult Living   1 0 0 0 0 0 0 0 0 0       Past Medical History  Diagnosis Date  . Acid reflux     Past Surgical History  Procedure Laterality Date  . Eye surgery      Family History  Problem Relation Age of Onset  . Other Mother     low estrogen   . Other Sister 38    low estrogen    History  Substance Use Topics  . Smoking status: Never Smoker   . Smokeless tobacco: Never Used  . Alcohol Use: No    Allergies: No Known Allergies  Prescriptions prior to admission  Medication Sig Dispense Refill  . acetaminophen (TYLENOL) 500 MG tablet Take 500 mg by mouth every 6 (six) hours as needed for mild pain or headache.      . Prenatal Vit-Fe Fumarate-FA (PRENATAL MULTIVITAMIN) TABS tablet Take 1 tablet by mouth daily at 12 noon.        ROS ROS: see HPI above, all other systems are negative  Physical Exam   Blood pressure 126/80, pulse 125, temperature 98.6 F (37 C), temperature source Oral, resp. rate 18, height 5\' 6"  (1.676 m), weight 95.709 kg (211 lb), last menstrual period 01/25/2013.  Physical Exam Chest: Clear Heart: RRR Abdomen: gravid, NT Extremities: WNL  Dilation: 1 Effacement (%): 70;60 Cervical Position: Middle Station: -2 Exam by:: Devoria Glassing RN@1900   Dilation: Fingertip Effacement (%): 50 Cervical Position: Anterior Station: -2 Exam by:: J. Shreya Lacasse, CNM@2015   FHT: Reactive NST UCs: Irregular  ED Course  IUP at [redacted]w[redacted]d Labor check  No cervical change  D/c home with precautions and comfort measures F/u tomorrow with an already scheduled ROB    Linda Hedges CNM, MSN 10/19/2013 7:19 PM

## 2013-10-19 NOTE — Discharge Instructions (Signed)

## 2013-10-29 ENCOUNTER — Inpatient Hospital Stay (HOSPITAL_COMMUNITY)
Admission: AD | Admit: 2013-10-29 | Discharge: 2013-10-29 | Disposition: A | Discharge status: Home / Self Care | Payer: Medicaid Other | Source: Ambulatory Visit | Attending: Obstetrics and Gynecology | Admitting: Obstetrics and Gynecology

## 2013-10-29 ENCOUNTER — Encounter (HOSPITAL_COMMUNITY): Payer: Self-pay | Admitting: *Deleted

## 2013-10-29 DIAGNOSIS — O9989 Other specified diseases and conditions complicating pregnancy, childbirth and the puerperium: Principal | ICD-10-CM

## 2013-10-29 DIAGNOSIS — O99891 Other specified diseases and conditions complicating pregnancy: Secondary | ICD-10-CM | POA: Insufficient documentation

## 2013-10-29 LAB — AMNISURE RUPTURE OF MEMBRANE (ROM) NOT AT ARMC: Amnisure ROM: NEGATIVE

## 2013-10-29 NOTE — Discharge Instructions (Signed)
Braxton Hicks Contractions Pregnancy is commonly associated with contractions of the uterus throughout the pregnancy. Towards the end of pregnancy (32 to 34 weeks), these contractions (Braxton Hicks) can develop more often and may become more forceful. This is not true labor because these contractions do not result in opening (dilatation) and thinning of the cervix. They are sometimes difficult to tell apart from true labor because these contractions can be forceful and people have different pain tolerances. You should not feel embarrassed if you go to the hospital with false labor. Sometimes, the only way to tell if you are in true labor is for your caregiver to follow the changes in the cervix. How to tell the difference between true and false labor:  False labor.  The contractions of false labor are usually shorter, irregular and not as hard as those of true labor.  They are often felt in the front of the lower abdomen and in the groin.  They may leave with walking around or changing positions while lying down.  They get weaker and are shorter lasting as time goes on.  These contractions are usually irregular.  They do not usually become progressively stronger, regular and closer together as with true labor.  True labor.  Contractions in true labor last 30 to 70 seconds, become very regular, usually become more intense, and increase in frequency.  They do not go away with walking.  The discomfort is usually felt in the top of the uterus and spreads to the lower abdomen and low back.  True labor can be determined by your caregiver with an exam. This will show that the cervix is dilating and getting thinner. If there are no prenatal problems or other health problems associated with the pregnancy, it is completely safe to be sent home with false labor and await the onset of true labor. HOME CARE INSTRUCTIONS   Keep up with your usual exercises and instructions.  Take medications as  directed.  Keep your regular prenatal appointment.  Eat and drink lightly if you think you are going into labor.  If BH contractions are making you uncomfortable:  Change your activity position from lying down or resting to walking/walking to resting.  Sit and rest in a tub of warm water.  Drink 2 to 3 glasses of water. Dehydration may cause B-H contractions.  Do slow and deep breathing several times an hour. SEEK IMMEDIATE MEDICAL CARE IF:   Your contractions continue to become stronger, more regular, and closer together.  You have a gushing, burst or leaking of fluid from the vagina.  An oral temperature above 102 F (38.9 C) develops.  You have passage of blood-tinged mucus.  You develop vaginal bleeding.  You develop continuous belly (abdominal) pain.  You have low back pain that you never had before.  You feel the baby's head pushing down causing pelvic pressure.  The baby is not moving as much as it used to. Document Released: 07/21/2005 Document Revised: 10/13/2011 Document Reviewed: 05/02/2013 [employer] Patient Information 2014 [employer].  Fetal Movement Counts Patient Name: __________________________________________________ Patient Due Date: ____________________ Performing a fetal movement count is highly recommended in high-risk pregnancies, but it is good for every pregnant woman to do. Your caregiver may ask you to start counting fetal movements at 28 weeks of the pregnancy. Fetal movements often increase:  After eating a full meal.  After physical activity.  After eating or drinking something sweet or cold.  At rest. Pay attention to when you feel   the baby is most active. This will help you notice a pattern of your baby's sleep and wake cycles and what factors contribute to an increase in fetal movement. It is important to perform a fetal movement count at the same time each day when your baby is normally most active.  HOW TO COUNT FETAL  MOVEMENTS 1. Find a quiet and comfortable area to sit or lie down on your left side. Lying on your left side provides the best blood and oxygen circulation to your baby. 2. Write down the day and time on a sheet of paper or in a journal. 3. Start counting kicks, flutters, swishes, rolls, or jabs in a 2 hour period. You should feel at least 10 movements within 2 hours. 4. If you do not feel 10 movements in 2 hours, wait 2 3 hours and count again. Look for a change in the pattern or not enough counts in 2 hours. SEEK MEDICAL CARE IF:  You feel less than 10 counts in 2 hours, tried twice.  There is no movement in over an hour.  The pattern is changing or taking longer each day to reach 10 counts in 2 hours.  You feel the baby is not moving as he or she usually does. Date: ____________ Movements: ____________ Start time: ____________ Finish time: ____________  Date: ____________ Movements: ____________ Start time: ____________ Finish time: ____________ Date: ____________ Movements: ____________ Start time: ____________ Finish time: ____________ Date: ____________ Movements: ____________ Start time: ____________ Finish time: ____________ Date: ____________ Movements: ____________ Start time: ____________ Finish time: ____________ Date: ____________ Movements: ____________ Start time: ____________ Finish time: ____________ Date: ____________ Movements: ____________ Start time: ____________ Finish time: ____________ Date: ____________ Movements: ____________ Start time: ____________ Finish time: ____________  Date: ____________ Movements: ____________ Start time: ____________ Finish time: ____________ Date: ____________ Movements: ____________ Start time: ____________ Finish time: ____________ Date: ____________ Movements: ____________ Start time: ____________ Finish time: ____________ Date: ____________ Movements: ____________ Start time: ____________ Finish time: ____________ Date: ____________  Movements: ____________ Start time: ____________ Finish time: ____________ Date: ____________ Movements: ____________ Start time: ____________ Finish time: ____________ Date: ____________ Movements: ____________ Start time: ____________ Finish time: ____________  Date: ____________ Movements: ____________ Start time: ____________ Finish time: ____________ Date: ____________ Movements: ____________ Start time: ____________ Finish time: ____________ Date: ____________ Movements: ____________ Start time: ____________ Finish time: ____________ Date: ____________ Movements: ____________ Start time: ____________ Finish time: ____________ Date: ____________ Movements: ____________ Start time: ____________ Finish time: ____________ Date: ____________ Movements: ____________ Start time: ____________ Finish time: ____________ Date: ____________ Movements: ____________ Start time: ____________ Finish time: ____________  Date: ____________ Movements: ____________ Start time: ____________ Finish time: ____________ Date: ____________ Movements: ____________ Start time: ____________ Finish time: ____________ Date: ____________ Movements: ____________ Start time: ____________ Finish time: ____________ Date: ____________ Movements: ____________ Start time: ____________ Finish time: ____________ Date: ____________ Movements: ____________ Start time: ____________ Finish time: ____________ Date: ____________ Movements: ____________ Start time: ____________ Finish time: ____________ Date: ____________ Movements: ____________ Start time: ____________ Finish time: ____________  Date: ____________ Movements: ____________ Start time: ____________ Finish time: ____________ Date: ____________ Movements: ____________ Start time: ____________ Finish time: ____________ Date: ____________ Movements: ____________ Start time: ____________ Finish time: ____________ Date: ____________ Movements: ____________ Start time:  ____________ Finish time: ____________ Date: ____________ Movements: ____________ Start time: ____________ Finish time: ____________ Date: ____________ Movements: ____________ Start time: ____________ Finish time: ____________ Date: ____________ Movements: ____________ Start time: ____________ Finish time: ____________  Date: ____________ Movements: ____________ Start time: ____________ Finish time: ____________ Date: ____________ Movements: ____________ Start   time: ____________ Finish time: ____________ Date: ____________ Movements: ____________ Start time: ____________ Finish time: ____________ Date: ____________ Movements: ____________ Start time: ____________ Finish time: ____________ Date: ____________ Movements: ____________ Start time: ____________ Finish time: ____________ Date: ____________ Movements: ____________ Start time: ____________ Finish time: ____________ Date: ____________ Movements: ____________ Start time: ____________ Finish time: ____________  Date: ____________ Movements: ____________ Start time: ____________ Finish time: ____________ Date: ____________ Movements: ____________ Start time: ____________ Finish time: ____________ Date: ____________ Movements: ____________ Start time: ____________ Finish time: ____________ Date: ____________ Movements: ____________ Start time: ____________ Finish time: ____________ Date: ____________ Movements: ____________ Start time: ____________ Finish time: ____________ Date: ____________ Movements: ____________ Start time: ____________ Finish time: ____________ Date: ____________ Movements: ____________ Start time: ____________ Finish time: ____________  Date: ____________ Movements: ____________ Start time: ____________ Finish time: ____________ Date: ____________ Movements: ____________ Start time: ____________ Finish time: ____________ Date: ____________ Movements: ____________ Start time: ____________ Finish time: ____________ Date:  ____________ Movements: ____________ Start time: ____________ Finish time: ____________ Date: ____________ Movements: ____________ Start time: ____________ Finish time: ____________ Date: ____________ Movements: ____________ Start time: ____________ Finish time: ____________ Document Released: 08/20/2006 Document Revised: 07/07/2012 Document Reviewed: 05/17/2012 [employer] Patient Information 2014 [employer].  

## 2013-10-29 NOTE — MAU Provider Note (Signed)
  History     CSN: [id number]  Arrival date and time: 10/29/13 2005   None     Chief Complaint  Patient presents with  . Labor Eval   HPI Comments: Pt is a G1P0 at [place] arrives unannounced c/o ?LOF, states it happened yesterday w saturation of underpants, and leaked a little bit more today. Denies any VB, reports occ ctx. Not wearing any pad currently, and no fluid on chux pad.      Past Medical History  Diagnosis Date  . Acid reflux     Past Surgical History  Procedure Laterality Date  . Eye surgery    . Eye surgery      at age 23 years old    Family History  Problem Relation Age of Onset  . Other Mother     low estrogen   . Other Sister 79    low estrogen    History  Substance Use Topics  . Smoking status: Never Smoker   . Smokeless tobacco: Never Used  . Alcohol Use: No    Allergies: No Known Allergies  Prescriptions prior to admission  Medication Sig Dispense Refill  . acetaminophen (TYLENOL) 500 MG tablet Take 500 mg by mouth every 6 (six) hours as needed for mild pain or headache.      Marland Kitchen EVENING PRIMROSE OIL PO Take 1 capsule by mouth 3 (three) times daily.      . Prenatal Vit-Fe Fumarate-FA (PRENATAL MULTIVITAMIN) TABS tablet Take 1 tablet by mouth daily at 12 noon.        Review of Systems  All other systems reviewed and are negative.   Physical Exam   Blood pressure 136/84, pulse 85, temperature 98.9 F (37.2 C), temperature source Oral, resp. rate 18, last menstrual period 01/25/2013.  Physical Exam  Nursing note and vitals reviewed. Constitutional: She is oriented to person, place, and time. She appears well-developed and well-nourished.  Cardiovascular: Normal rate.   Respiratory: Effort normal.  GI: Soft.  Genitourinary: Vagina normal. No vaginal discharge found.  Musculoskeletal: Normal range of motion.  Neurological: She is alert and oriented to person, place, and time. She has normal reflexes.  Skin: Skin is warm and dry.   Psychiatric: She has a normal mood and affect. Her behavior is normal.    MAU Course  Procedures    Assessment and Plan  IUP at 40wks amnisure neg Cervix 1cm FHR cat 1 w rare ctx  Dc home, keep scheduled f/u  rv'd [place] and labor   Keyion Knack M 10/29/2013, 10:57 PM

## 2013-10-29 NOTE — MAU Note (Signed)
?   Water starting leaking at 3:30 pm.  Clear, reports baby moving well.  Denies bleeding.

## 2013-10-29 NOTE — MAU Note (Signed)
PT   SAYS  SHE THINKS SROM YESTERDAY  AT [place] [facility]- AND HAS CONTINUED  TO  LEAK.-  NO PAD ON IN TRIAGE -  SAYS  HER PERINEUM IS DRY  BECAUSE SHE TOOK SHOWER AT HOME.    VE IN  OFFICE  1 CM.  DENIES HSV AND MRSA.   DID NOT CALL DR.

## 2013-11-07 ENCOUNTER — Telehealth (HOSPITAL_COMMUNITY): Payer: Self-pay | Admitting: *Deleted

## 2013-11-11 ENCOUNTER — Encounter (HOSPITAL_COMMUNITY): Payer: Self-pay

## 2013-11-11 ENCOUNTER — Inpatient Hospital Stay (HOSPITAL_COMMUNITY)
Admission: RE | Admit: 2013-11-11 | Discharge: 2013-11-16 | DRG: 766 | Disposition: A | Discharge status: Home / Self Care | Payer: Medicaid Other | Source: Ambulatory Visit | Attending: Obstetrics and Gynecology | Admitting: Obstetrics and Gynecology

## 2013-11-11 DIAGNOSIS — O9989 Other specified diseases and conditions complicating pregnancy, childbirth and the puerperium: Secondary | ICD-10-CM

## 2013-11-11 DIAGNOSIS — O34599 Maternal care for other abnormalities of gravid uterus, unspecified trimester: Secondary | ICD-10-CM | POA: Diagnosis present

## 2013-11-11 DIAGNOSIS — O480 Post-term pregnancy: Principal | ICD-10-CM | POA: Diagnosis present

## 2013-11-11 DIAGNOSIS — O3660X0 Maternal care for excessive fetal growth, unspecified trimester, not applicable or unspecified: Secondary | ICD-10-CM | POA: Diagnosis present

## 2013-11-11 DIAGNOSIS — D259 Leiomyoma of uterus, unspecified: Secondary | ICD-10-CM | POA: Diagnosis present

## 2013-11-11 DIAGNOSIS — O358XX0 Maternal care for other (suspected) fetal abnormality and damage, not applicable or unspecified: Secondary | ICD-10-CM | POA: Diagnosis present

## 2013-11-11 DIAGNOSIS — O99892 Other specified diseases and conditions complicating childbirth: Secondary | ICD-10-CM | POA: Diagnosis present

## 2013-11-11 DIAGNOSIS — O9903 Anemia complicating the puerperium: Secondary | ICD-10-CM | POA: Diagnosis not present

## 2013-11-11 DIAGNOSIS — D649 Anemia, unspecified: Secondary | ICD-10-CM | POA: Diagnosis present

## 2013-11-11 DIAGNOSIS — O3410 Maternal care for benign tumor of corpus uteri, unspecified trimester: Secondary | ICD-10-CM

## 2013-11-11 DIAGNOSIS — Y9210 Unspecified residential institution as the place of occurrence of the external cause: Secondary | ICD-10-CM | POA: Diagnosis not present

## 2013-11-11 DIAGNOSIS — X58XXXA Exposure to other specified factors, initial encounter: Secondary | ICD-10-CM | POA: Diagnosis not present

## 2013-11-11 DIAGNOSIS — Z22330 Carrier of Group B streptococcus: Secondary | ICD-10-CM

## 2013-11-11 DIAGNOSIS — T783XXA Angioneurotic edema, initial encounter: Secondary | ICD-10-CM | POA: Diagnosis not present

## 2013-11-11 DIAGNOSIS — L0292 Furuncle, unspecified: Secondary | ICD-10-CM | POA: Diagnosis present

## 2013-11-11 DIAGNOSIS — D4959 Neoplasm of unspecified behavior of other genitourinary organ: Secondary | ICD-10-CM | POA: Diagnosis present

## 2013-11-11 DIAGNOSIS — L0293 Carbuncle, unspecified: Secondary | ICD-10-CM

## 2013-11-11 LAB — CBC
HEMATOCRIT: 30.7 % — AB (ref 36.0–46.0)
Hemoglobin: 10.7 g/dL — ABNORMAL LOW (ref 12.0–15.0)
MCH: 23.4 pg — ABNORMAL LOW (ref 26.0–34.0)
MCHC: 34.9 g/dL (ref 30.0–36.0)
MCV: 67 fL — AB (ref 78.0–100.0)
Platelets: 307 10*3/uL (ref 150–400)
RBC: 4.58 MIL/uL (ref 3.87–5.11)
RDW: 16.5 % — ABNORMAL HIGH (ref 11.5–15.5)
WBC: 9.6 10*3/uL (ref 4.0–10.5)

## 2013-11-11 MED ORDER — CITRIC ACID-SODIUM CITRATE 334-500 MG/5ML PO SOLN
30.0000 mL | ORAL | Status: DC | PRN
  Administered 2013-11-13: 30 mL via ORAL
  Filled 2013-11-11: qty 15

## 2013-11-11 MED ORDER — MISOPROSTOL 25 MCG QUARTER TABLET
25.0000 ug | ORAL_TABLET | ORAL | Status: DC | PRN
  Administered 2013-11-11 – 2013-11-12 (×2): 25 ug via VAGINAL
  Filled 2013-11-11 (×2): qty 0.25

## 2013-11-11 MED ORDER — LACTATED RINGERS IV SOLN
INTRAVENOUS | Status: DC
  Administered 2013-11-12: 250 mL via INTRAVENOUS
  Administered 2013-11-12: 125 mL/h via INTRAVENOUS
  Administered 2013-11-12 – 2013-11-13 (×5): via INTRAVENOUS

## 2013-11-11 MED ORDER — TERBUTALINE SULFATE 1 MG/ML IJ SOLN: 0.2500 mg | Freq: Once | INTRAMUSCULAR | Status: AC | PRN

## 2013-11-11 MED ORDER — SODIUM CHLORIDE 0.9 % IJ SOLN: 3.0000 mL | Freq: Two times a day (BID) | INTRAMUSCULAR | Status: DC

## 2013-11-11 MED ORDER — LIDOCAINE HCL (PF) 1 % IJ SOLN: 30.0000 mL | INTRAMUSCULAR | Status: DC | PRN

## 2013-11-11 MED ORDER — OXYTOCIN BOLUS FROM INFUSION: 500.0000 mL | INTRAVENOUS | Status: DC

## 2013-11-11 MED ORDER — OXYCODONE-ACETAMINOPHEN 5-325 MG PO TABS: 1.0000 | ORAL_TABLET | ORAL | Status: DC | PRN

## 2013-11-11 MED ORDER — FLEET ENEMA 7-19 GM/118ML RE ENEM: 1.0000 | ENEMA | RECTAL | Status: DC | PRN

## 2013-11-11 MED ORDER — PENICILLIN G POTASSIUM 5000000 UNITS IJ SOLR
2.5000 10*6.[IU] | INTRAVENOUS | Status: DC
  Filled 2013-11-11 (×2): qty 2.5

## 2013-11-11 MED ORDER — IBUPROFEN 600 MG PO TABS: 600.0000 mg | ORAL_TABLET | Freq: Four times a day (QID) | ORAL | Status: DC | PRN

## 2013-11-11 MED ORDER — ACETAMINOPHEN 325 MG PO TABS: 650.0000 mg | ORAL_TABLET | ORAL | Status: DC | PRN

## 2013-11-11 MED ORDER — PENICILLIN G POTASSIUM 5000000 UNITS IJ SOLR
5.0000 10*6.[IU] | Freq: Once | INTRAVENOUS | Status: DC
  Filled 2013-11-11: qty 5

## 2013-11-11 MED ORDER — ZOLPIDEM TARTRATE 5 MG PO TABS
5.0000 mg | ORAL_TABLET | Freq: Every evening | ORAL | Status: DC | PRN
  Administered 2013-11-11: 5 mg via ORAL
  Filled 2013-11-11 (×2): qty 1

## 2013-11-11 MED ORDER — ONDANSETRON HCL 4 MG/2ML IJ SOLN: 4.0000 mg | Freq: Four times a day (QID) | INTRAMUSCULAR | Status: DC | PRN

## 2013-11-11 MED ORDER — OXYTOCIN 40 UNITS IN LACTATED RINGERS INFUSION - SIMPLE MED: 62.5000 mL/h | INTRAVENOUS | Status: DC

## 2013-11-11 MED ORDER — LACTATED RINGERS IV SOLN
500.0000 mL | INTRAVENOUS | Status: DC | PRN
  Administered 2013-11-12 – 2013-11-13 (×2): 500 mL via INTRAVENOUS

## 2013-11-11 NOTE — H&P (Signed)
Caitlin Hall is a 23 y.o. female presenting for IOL for postdates. Denies any reg ctx, VB or LOF, +FM.   Pregnancy course:  Pt began [facility] at [place] at 9wks, [facility] based on LMP =10/31/13 1st trim screen WNL  19wks anatomy US, normal anatomy, except for clubfoot noted, referred to MFM and confirmed findings, pt declined any further genetic screening, fibroid noted left LUS 7cm x7cm Normal AFP Pt had initial consult with orthopedics re: clubfoot Normal 1hr gtt At 29wks, neg FFN, neg GC/CT  GBS pos At 40wks, isolated increased BP, some c/o headaches, PIH labs were normal and BP recheck normal  41wks BPP 8/8, EFW 9#6oz, AFI 19.8cm, pt declined elective c/s,     Maternal Medical History:  Reason for admission: IOL  Contractions: Frequency: rare.    Fetal activity: Perceived fetal activity is normal.   Last perceived fetal movement was within the past hour.    Prenatal complications: no prenatal complications Prenatal Complications - Diabetes: none.    OB History   Grav Para Term Preterm Abortions TAB SAB Ect Mult Living   1 0 0 0 0 0 0 0 0 0      Past Medical History  Diagnosis Date  . Acid reflux    Past Surgical History  Procedure Laterality Date  . Eye surgery    . Eye surgery      at age 61 years old   Family History: family history includes Other in her mother; Other (age of onset: 61) in her sister. Social History:  reports that she has never smoked. She has never used smokeless tobacco. She reports that she does not drink alcohol or use illicit drugs.   Prenatal Transfer Tool  Maternal Diabetes: No Genetic Screening: Declined Maternal Ultrasounds/Referrals: Abnormal:  Findings:   Other: club foot  Fetal Ultrasounds or other Referrals:  Referred to Materal Fetal Medicine  for [place] to confirm club foot Maternal Substance Abuse:  No Significant Maternal Medications:  None Significant Maternal Lab Results:  Lab values include: Group B Strep positive Other Comments:   None  Review of Systems  Skin:       Boil on L inner thigh, draining some   All other systems reviewed and are negative.   Dilation: 1 Effacement (%): 50 Station: -3 Exam by:: S.Tramond Slinker, CNM Blood pressure 129/71, pulse 91, temperature 98.5 F (36.9 C), temperature source Oral, resp. rate 16, height 5\' 6"  (1.676 m), weight 212 lb (96.163 kg), last menstrual period 01/25/2013. Maternal Exam:  Uterine Assessment: Contraction frequency is rare.   Abdomen: Patient reports no abdominal tenderness. Fundal height is aga.   Estimated fetal weight is EFW on [place] 4/10 9#6oz .   Fetal presentation: vertex  Introitus: Normal vulva. Normal vagina.  Amniotic fluid character: not assessed.  Pelvis: adequate for delivery.   Cervix: Cervix evaluated by digital exam.     Fetal Exam Fetal Monitor Review: Mode: ultrasound.    Fetal State Assessment: Category I - tracings are normal.     Physical Exam  Nursing note and vitals reviewed. Constitutional: She is oriented to person, place, and time. She appears well-developed and well-nourished.  HENT:  Head: Normocephalic.  Eyes: Pupils are equal, round, and reactive to light.  Neck: Normal range of motion.  Cardiovascular: Normal rate, regular rhythm and normal heart sounds.   Respiratory: Effort normal and breath sounds normal.  GI: Soft. Bowel sounds are normal.  Genitourinary: Vagina normal.  Musculoskeletal: Normal range of motion. She exhibits edema.  Neurological: She is alert and oriented to person, place, and time. She has normal reflexes.  Skin: Skin is warm and dry.  Small Boil on inner L thigh, beginning to drain, firm, white pus coming out when touched, able to squeeze out more, and feels soft   Psychiatric: She has a normal mood and affect. Her behavior is normal.    Prenatal labs: ABO, Rh: O/Positive/-- (08/15 0000) Antibody: Negative (08/15 0000) Rubella: Immune (08/15 0000) RPR: Nonreactive (08/15 0000)  HBsAg: Negative  (08/15 0000)  HIV: Non-reactive (08/15 0000)  GBS: Positive (08/15 0000)  hgb electrophoresis, +hgb C   Assessment/Plan: IUP at [redacted]w[redacted]d Unfavorable cervix GBS pos FHR cat 1 Macrosomia   Admit to b.s per c/w Dr Mancel Bale Routine L&D orders PCN for GBS prophylaxis, to begin now, due to boil  cytotec 49mcg PV q4h overnight, pitocin when appropriate    Charlane Ferretti 11/11/2013, 11:41 PM

## 2013-11-12 ENCOUNTER — Inpatient Hospital Stay (HOSPITAL_COMMUNITY): Payer: Medicaid Other | Admitting: Anesthesiology

## 2013-11-12 ENCOUNTER — Encounter (HOSPITAL_COMMUNITY): Payer: Medicaid Other | Admitting: Anesthesiology

## 2013-11-12 LAB — TYPE AND SCREEN
ABO/RH(D): O POS
ANTIBODY SCREEN: NEGATIVE

## 2013-11-12 LAB — RPR

## 2013-11-12 LAB — ABO/RH: ABO/RH(D): O POS

## 2013-11-12 MED ORDER — FENTANYL 2.5 MCG/ML BUPIVACAINE 1/10 % EPIDURAL INFUSION (WH - ANES)
14.0000 mL/h | INTRAMUSCULAR | Status: DC | PRN
  Administered 2013-11-12 – 2013-11-13 (×4): 14 mL/h via EPIDURAL
  Filled 2013-11-12 (×4): qty 125

## 2013-11-12 MED ORDER — EPHEDRINE 5 MG/ML INJ: 10.0000 mg | INTRAVENOUS | Status: DC | PRN

## 2013-11-12 MED ORDER — LACTATED RINGERS IV SOLN: 500.0000 mL | Freq: Once | INTRAVENOUS | Status: DC

## 2013-11-12 MED ORDER — EPHEDRINE 5 MG/ML INJ
10.0000 mg | INTRAVENOUS | Status: DC | PRN
  Filled 2013-11-12: qty 4

## 2013-11-12 MED ORDER — OXYTOCIN 40 UNITS IN LACTATED RINGERS INFUSION - SIMPLE MED
1.0000 m[IU]/min | INTRAVENOUS | Status: DC
  Administered 2013-11-12: 1 m[IU]/min via INTRAVENOUS
  Filled 2013-11-12: qty 1000

## 2013-11-12 MED ORDER — OXYTOCIN 40 UNITS IN LACTATED RINGERS INFUSION - SIMPLE MED: 1.0000 m[IU]/min | INTRAVENOUS | Status: DC

## 2013-11-12 MED ORDER — SODIUM BICARBONATE 8.4 % IV SOLN
INTRAVENOUS | Status: DC | PRN
  Administered 2013-11-12 – 2013-11-13 (×4): 5 mL via EPIDURAL

## 2013-11-12 MED ORDER — PENICILLIN G POTASSIUM 5000000 UNITS IJ SOLR
5.0000 10*6.[IU] | Freq: Once | INTRAVENOUS | Status: AC
  Administered 2013-11-12: 5 10*6.[IU] via INTRAVENOUS
  Filled 2013-11-12: qty 5

## 2013-11-12 MED ORDER — PHENYLEPHRINE 40 MCG/ML (10ML) SYRINGE FOR IV PUSH (FOR BLOOD PRESSURE SUPPORT)
80.0000 ug | PREFILLED_SYRINGE | INTRAVENOUS | Status: DC | PRN
  Filled 2013-11-12: qty 10

## 2013-11-12 MED ORDER — PHENYLEPHRINE 40 MCG/ML (10ML) SYRINGE FOR IV PUSH (FOR BLOOD PRESSURE SUPPORT): 80.0000 ug | PREFILLED_SYRINGE | INTRAVENOUS | Status: DC | PRN

## 2013-11-12 MED ORDER — PENICILLIN G POTASSIUM 5000000 UNITS IJ SOLR
2.5000 10*6.[IU] | INTRAVENOUS | Status: DC
  Administered 2013-11-12 – 2013-11-13 (×9): 2.5 10*6.[IU] via INTRAVENOUS
  Filled 2013-11-12 (×13): qty 2.5

## 2013-11-12 MED ORDER — DIPHENHYDRAMINE HCL 50 MG/ML IJ SOLN
12.5000 mg | INTRAMUSCULAR | Status: DC | PRN
  Administered 2013-11-13: 12.5 mg via INTRAVENOUS
  Filled 2013-11-12: qty 1

## 2013-11-12 MED ORDER — BUTORPHANOL TARTRATE 1 MG/ML IJ SOLN
1.0000 mg | INTRAMUSCULAR | Status: DC | PRN
Start: 2013-11-12 — End: 2013-11-13
  Administered 2013-11-12: 1 mg via INTRAVENOUS
  Filled 2013-11-12: qty 1

## 2013-11-12 NOTE — Progress Notes (Signed)
Pt changed position from rt lateral to high fowlers.  fhr dropped. Pt turned to left side.  RN had difficulty hearing fhr.  Monitoring changed from telemetry to external cardio with [facility]

## 2013-11-12 NOTE — Progress Notes (Signed)
  Subjective: Reporting more pain with UCs.  FOB supportive at bedside.    Objective: BP 130/72  Pulse 95  Temp(Src) 98.1 F (36.7 C) (Axillary)  Resp 20  Ht 5\' 6"  (1.676 m)  Wt 212 lb (96.163 kg)  BMI 34.23 kg/m2  LMP 01/25/2013      FHT: Category 2--FHR 110-120, sporadic accels.  Category 1 up to 1156,  ? Current sleep cycle UC:   regular, every 2 minutes SVE:   Dilation: 1 Effacement (%): 70 Station: -2 Exam by:: [name] CNM--no change from previous exam. Pitocin at 1 mu/min--d/c'd due to tachysystole.  Assessment:  Induction of labor Latent phase GBS positive  Plan: Reviewed options for pain med.  Patient elects IV med as initial option. Will likely choose epidural as labor progresses.  [name] 11/12/2013, 12:23 PM

## 2013-11-12 NOTE — Anesthesia Preprocedure Evaluation (Signed)
Anesthesia Evaluation  Patient identified by MRN, date of birth, ID band Patient awake    Reviewed: Allergy & Precautions, H&P , Patient's Chart, lab work & pertinent test results  Airway Mallampati: II  TM Distance: >3 FB Neck ROM: full    Dental  (+) Teeth Intact   Pulmonary  breath sounds clear to auscultation        Cardiovascular Rhythm:regular Rate:Normal     Neuro/Psych    GI/Hepatic GERD-  ,  Endo/Other    Renal/GU      Musculoskeletal   Abdominal   Peds  Hematology   Anesthesia Other Findings       Reproductive/Obstetrics (+) Pregnancy                             Anesthesia Physical Anesthesia Plan  ASA: II  Anesthesia Plan: Epidural   Post-op Pain Management:    Induction:   Airway Management Planned:   Additional Equipment:   Intra-op Plan:   Post-operative Plan:   Informed Consent: I have reviewed the patients History and Physical, chart, labs and discussed the procedure including the risks, benefits and alternatives for the proposed anesthesia with the patient or authorized representative who has indicated his/her understanding and acceptance.   Dental Advisory Given  Plan Discussed with:   Anesthesia Plan Comments: (Labs checked- platelets confirmed with RN in room. Fetal heart tracing, per RN, reported to be stable enough for sitting procedure. Discussed epidural, and patient consents to the procedure:  included risk of possible headache,backache, failed block, allergic reaction, and nerve injury. This patient was asked if she had any questions or concerns before the procedure started.)        Anesthesia Quick Evaluation  

## 2013-11-12 NOTE — Anesthesia Procedure Notes (Signed)

## 2013-11-12 NOTE — Progress Notes (Signed)
  Subjective: Showered, eating light breakfast.  Reports contracting most of night, with little sleep.  Contractions are less frequent now, but some are painful.  FOB at bedside.  Objective: BP 128/76  Pulse 84  Temp(Src) 97.5 F (36.4 C) (Oral)  Resp 16  Ht 5\' 6"  (1.676 m)  Wt 212 lb (96.163 kg)  BMI 34.23 kg/m2  LMP 01/25/2013    Received 2 doses Cytotech--last dose 0125, with frequent contractions after that dose.  FHT: Category 1 on earlier tracing.  Off monitor at present for shower and breakfast. UC:   Irregular SVE:   Deferred at present  Assessment:  Induction for postdates LGA infant, with EFW 9+6 on [place] 4/10 GBS positive--1st dose PCN received at [place]: Will recheck cervix after breakfast completed. Plan pitocin if cervix appropriate. Reviewed plan of care, including issue of LGA, possibilites of shoulder dystocia, prolonged labor, need for serial induction.  Patient still wishes to proceed with induction. Pain medication prn.  [name] 11/12/2013, 8:12 AM

## 2013-11-12 NOTE — Progress Notes (Signed)
  Subjective: Completed light breakfast.  Aware of contractions, some painful.  Objective: BP 130/70  Pulse 80  Temp(Src) 97.5 F (36.4 C) (Oral)  Resp 16  Ht 5\' 6"  (1.676 m)  Wt 212 lb (96.163 kg)  BMI 34.23 kg/m2  LMP 01/25/2013      FHT: Category 1 UC:   regular, every 3 minutes SVE:   Dilation: 1 Effacement (%): 70 Station: -2 Exam by:: V. Elo Marmolejos CNM    Assessment:  Induction of labor, post-dates, LGA infant GBS positive  Plan: Augment with pitocin per low-dose protocol. Pain med prn.  Caitlin Hall 11/12/2013, 9:34 AM

## 2013-11-12 NOTE — Progress Notes (Signed)
  Subjective: Much more comfortable s/p epidural.  Objective: BP 122/71  Pulse 92  Temp(Src) 98.2 F (36.8 C) (Oral)  Resp 18  Ht 5\' 6"  (1.676 m)  Wt 212 lb (96.163 kg)  BMI 34.23 kg/m2  SpO2 100%  LMP 01/25/2013      FHT: Category 1 when not in sleep cycle--Category 2 when sleep cycle, with decreased variability, no decels UC:   irregular, every 2-4 minutes SVE:   Dilation: 2 Effacement (%): 90 Station: -2 Exam by:: Hailei Besser cnm Pitocin at 2 mu/min  Assessment:  Induction for postdates LGA GBS positive Early labor  Plan: Continue pitocin augmentation Report to Arville Go, CNM--she will f/u.  [name] 11/12/2013, 6:30 PM

## 2013-11-12 NOTE — Progress Notes (Signed)
  Subjective: Wants to discuss pain med options.  Breathing with contractions.  Objective: BP 128/77  Pulse 88  Temp(Src) 98.2 F (36.8 C) (Oral)  Resp 18  Ht 5\' 6"  (1.676 m)  Wt 212 lb (96.163 kg)  BMI 34.23 kg/m2  LMP 01/25/2013      FHT: Category 1 UC:   regular, every 2-3 minutes SVE:   Dilation: 2 Effacement (%): 90 Station: -2 Exam by:: Kyrell Ruacho cnm BBOW Pitocin at 1 mu/min  Assessment:  Induction for post-dates LGA--EFW 9+6 Early labor GBS positive  Plan: Reviewed IV meds and epidural. Patient elects to proceed with epidural.  [name] 11/12/2013, 4:29 PM

## 2013-11-13 ENCOUNTER — Encounter (HOSPITAL_COMMUNITY)
Admission: RE | Disposition: A | Discharge status: Home / Self Care | Payer: Self-pay | Source: Ambulatory Visit | Attending: Obstetrics and Gynecology

## 2013-11-13 ENCOUNTER — Encounter (HOSPITAL_COMMUNITY): Payer: Self-pay

## 2013-11-13 SURGERY — Surgical Case
Anesthesia: Epidural | Site: Abdomen

## 2013-11-13 MED ORDER — OXYCODONE-ACETAMINOPHEN 5-325 MG PO TABS
1.0000 | ORAL_TABLET | ORAL | Status: DC | PRN
  Administered 2013-11-14 – 2013-11-16 (×6): 1 via ORAL
  Filled 2013-11-13 (×6): qty 1

## 2013-11-13 MED ORDER — LACTATED RINGERS IV SOLN
40.0000 [IU] | INTRAVENOUS | Status: DC | PRN
  Administered 2013-11-13: 40 [IU] via INTRAVENOUS

## 2013-11-13 MED ORDER — MENTHOL 3 MG MT LOZG: 1.0000 | LOZENGE | OROMUCOSAL | Status: DC | PRN

## 2013-11-13 MED ORDER — KETOROLAC TROMETHAMINE 30 MG/ML IJ SOLN
30.0000 mg | Freq: Four times a day (QID) | INTRAMUSCULAR | Status: AC | PRN
  Administered 2013-11-13: 30 mg via INTRAVENOUS

## 2013-11-13 MED ORDER — DIPHENHYDRAMINE HCL 50 MG/ML IJ SOLN: 25.0000 mg | INTRAMUSCULAR | Status: DC | PRN

## 2013-11-13 MED ORDER — LACTATED RINGERS IV SOLN
INTRAVENOUS | Status: DC | PRN
  Administered 2013-11-13: 18:00:00 via INTRAVENOUS

## 2013-11-13 MED ORDER — ZOLPIDEM TARTRATE 5 MG PO TABS: 5.0000 mg | ORAL_TABLET | Freq: Every evening | ORAL | Status: DC | PRN

## 2013-11-13 MED ORDER — SODIUM CHLORIDE 0.9 % IJ SOLN: 3.0000 mL | INTRAMUSCULAR | Status: DC | PRN

## 2013-11-13 MED ORDER — SIMETHICONE 80 MG PO CHEW
80.0000 mg | CHEWABLE_TABLET | ORAL | Status: DC
  Administered 2013-11-14 – 2013-11-15 (×3): 80 mg via ORAL
  Filled 2013-11-13 (×3): qty 1

## 2013-11-13 MED ORDER — WITCH HAZEL-GLYCERIN EX PADS: 1.0000 "application " | MEDICATED_PAD | CUTANEOUS | Status: DC | PRN

## 2013-11-13 MED ORDER — OXYTOCIN 40 UNITS IN LACTATED RINGERS INFUSION - SIMPLE MED: 62.5000 mL/h | INTRAVENOUS | Status: AC

## 2013-11-13 MED ORDER — MEASLES, MUMPS & RUBELLA VAC ~~LOC~~ INJ
0.5000 mL | INJECTION | Freq: Once | SUBCUTANEOUS | Status: DC
  Filled 2013-11-13: qty 0.5

## 2013-11-13 MED ORDER — LACTATED RINGERS IV SOLN
INTRAVENOUS | Status: DC
  Administered 2013-11-14: 02:00:00 via INTRAVENOUS

## 2013-11-13 MED ORDER — KETOROLAC TROMETHAMINE 30 MG/ML IJ SOLN
INTRAMUSCULAR | Status: AC
  Administered 2013-11-13: 30 mg via INTRAVENOUS
  Filled 2013-11-13: qty 1

## 2013-11-13 MED ORDER — CEFAZOLIN SODIUM-DEXTROSE 2-3 GM-% IV SOLR
2.0000 g | Freq: Once | INTRAVENOUS | Status: AC
  Administered 2013-11-13: 2 g via INTRAVENOUS
  Filled 2013-11-13: qty 50

## 2013-11-13 MED ORDER — ONDANSETRON HCL 4 MG PO TABS: 4.0000 mg | ORAL_TABLET | ORAL | Status: DC | PRN

## 2013-11-13 MED ORDER — MEPERIDINE HCL 25 MG/ML IJ SOLN
INTRAMUSCULAR | Status: DC | PRN
  Administered 2013-11-13 (×2): 12.5 mg via INTRAVENOUS

## 2013-11-13 MED ORDER — METOCLOPRAMIDE HCL 5 MG/ML IJ SOLN: 10.0000 mg | Freq: Three times a day (TID) | INTRAMUSCULAR | Status: DC | PRN

## 2013-11-13 MED ORDER — PRENATAL MULTIVITAMIN CH
1.0000 | ORAL_TABLET | Freq: Every day | ORAL | Status: DC
  Administered 2013-11-14 – 2013-11-16 (×3): 1 via ORAL
  Filled 2013-11-13 (×3): qty 1

## 2013-11-13 MED ORDER — NALOXONE HCL 1 MG/ML IJ SOLN
1.0000 ug/kg/h | INTRAVENOUS | Status: DC | PRN
  Filled 2013-11-13: qty 2

## 2013-11-13 MED ORDER — LANOLIN HYDROUS EX OINT: 1.0000 "application " | TOPICAL_OINTMENT | CUTANEOUS | Status: DC | PRN

## 2013-11-13 MED ORDER — IBUPROFEN 600 MG PO TABS
600.0000 mg | ORAL_TABLET | Freq: Four times a day (QID) | ORAL | Status: DC
  Administered 2013-11-14 – 2013-11-16 (×10): 600 mg via ORAL
  Filled 2013-11-13 (×10): qty 1

## 2013-11-13 MED ORDER — SIMETHICONE 80 MG PO CHEW
80.0000 mg | CHEWABLE_TABLET | ORAL | Status: DC | PRN
  Administered 2013-11-15 (×2): 80 mg via ORAL
  Filled 2013-11-13 (×2): qty 1

## 2013-11-13 MED ORDER — NALBUPHINE HCL 10 MG/ML IJ SOLN: 5.0000 mg | INTRAMUSCULAR | Status: DC | PRN

## 2013-11-13 MED ORDER — SENNOSIDES-DOCUSATE SODIUM 8.6-50 MG PO TABS
2.0000 | ORAL_TABLET | ORAL | Status: DC
  Administered 2013-11-14 – 2013-11-15 (×3): 2 via ORAL
  Filled 2013-11-13 (×3): qty 2

## 2013-11-13 MED ORDER — ONDANSETRON HCL 4 MG/2ML IJ SOLN
INTRAMUSCULAR | Status: DC | PRN
  Administered 2013-11-13: 4 mg via INTRAVENOUS

## 2013-11-13 MED ORDER — SCOPOLAMINE 1 MG/3DAYS TD PT72
MEDICATED_PATCH | TRANSDERMAL | Status: AC
  Administered 2013-11-13: 1.5 mg via TRANSDERMAL
  Filled 2013-11-13: qty 1

## 2013-11-13 MED ORDER — MORPHINE SULFATE (PF) 0.5 MG/ML IJ SOLN
INTRAMUSCULAR | Status: DC | PRN
  Administered 2013-11-13: 5 mg via EPIDURAL

## 2013-11-13 MED ORDER — BISACODYL 10 MG RE SUPP
10.0000 mg | Freq: Every day | RECTAL | Status: DC | PRN
  Administered 2013-11-15: 10 mg via RECTAL
  Filled 2013-11-13: qty 1

## 2013-11-13 MED ORDER — DIPHENHYDRAMINE HCL 25 MG PO CAPS
25.0000 mg | ORAL_CAPSULE | ORAL | Status: DC | PRN
  Administered 2013-11-14: 25 mg via ORAL
  Filled 2013-11-13: qty 1

## 2013-11-13 MED ORDER — FERROUS SULFATE 325 (65 FE) MG PO TABS
325.0000 mg | ORAL_TABLET | Freq: Two times a day (BID) | ORAL | Status: DC
  Administered 2013-11-14 – 2013-11-16 (×5): 325 mg via ORAL
  Filled 2013-11-13 (×5): qty 1

## 2013-11-13 MED ORDER — DIPHENHYDRAMINE HCL 50 MG/ML IJ SOLN: 12.5000 mg | INTRAMUSCULAR | Status: DC | PRN

## 2013-11-13 MED ORDER — FLEET ENEMA 7-19 GM/118ML RE ENEM: 1.0000 | ENEMA | Freq: Every day | RECTAL | Status: DC | PRN

## 2013-11-13 MED ORDER — KETOROLAC TROMETHAMINE 30 MG/ML IJ SOLN: 30.0000 mg | Freq: Four times a day (QID) | INTRAMUSCULAR | Status: AC | PRN

## 2013-11-13 MED ORDER — MORPHINE SULFATE 0.5 MG/ML IJ SOLN
INTRAMUSCULAR | Status: AC
  Filled 2013-11-13: qty 10

## 2013-11-13 MED ORDER — FENTANYL CITRATE 0.05 MG/ML IJ SOLN
INTRAMUSCULAR | Status: DC | PRN
  Administered 2013-11-13 (×2): 50 ug via INTRAVENOUS

## 2013-11-13 MED ORDER — MEPERIDINE HCL 25 MG/ML IJ SOLN: 6.2500 mg | INTRAMUSCULAR | Status: DC | PRN

## 2013-11-13 MED ORDER — NALBUPHINE HCL 10 MG/ML IJ SOLN
5.0000 mg | INTRAMUSCULAR | Status: DC | PRN
  Administered 2013-11-14: 10 mg via SUBCUTANEOUS
  Filled 2013-11-13: qty 1

## 2013-11-13 MED ORDER — SCOPOLAMINE 1 MG/3DAYS TD PT72
1.0000 | MEDICATED_PATCH | Freq: Once | TRANSDERMAL | Status: DC
  Administered 2013-11-13: 1.5 mg via TRANSDERMAL

## 2013-11-13 MED ORDER — HYDROMORPHONE HCL PF 1 MG/ML IJ SOLN
INTRAMUSCULAR | Status: AC
  Administered 2013-11-13: 0.5 mg via INTRAVENOUS
  Filled 2013-11-13: qty 1

## 2013-11-13 MED ORDER — SIMETHICONE 80 MG PO CHEW
80.0000 mg | CHEWABLE_TABLET | Freq: Three times a day (TID) | ORAL | Status: DC
  Administered 2013-11-14 – 2013-11-16 (×7): 80 mg via ORAL
  Filled 2013-11-13 (×7): qty 1

## 2013-11-13 MED ORDER — ONDANSETRON HCL 4 MG/2ML IJ SOLN: 4.0000 mg | Freq: Three times a day (TID) | INTRAMUSCULAR | Status: DC | PRN

## 2013-11-13 MED ORDER — DIBUCAINE 1 % RE OINT: 1.0000 "application " | TOPICAL_OINTMENT | RECTAL | Status: DC | PRN

## 2013-11-13 MED ORDER — NALOXONE HCL 0.4 MG/ML IJ SOLN: 0.4000 mg | INTRAMUSCULAR | Status: DC | PRN

## 2013-11-13 MED ORDER — HYDROMORPHONE HCL PF 1 MG/ML IJ SOLN
0.2500 mg | INTRAMUSCULAR | Status: DC | PRN
  Administered 2013-11-13 (×2): 0.5 mg via INTRAVENOUS

## 2013-11-13 MED ORDER — LACTATED RINGERS IV SOLN
INTRAVENOUS | Status: DC
Start: 2013-11-13 — End: 2013-11-13
  Administered 2013-11-13: 300 mL via INTRAUTERINE

## 2013-11-13 MED ORDER — LIDOCAINE-EPINEPHRINE (PF) 2 %-1:200000 IJ SOLN
INTRAMUSCULAR | Status: AC
  Filled 2013-11-13: qty 20

## 2013-11-13 MED ORDER — DIPHENHYDRAMINE HCL 25 MG PO CAPS: 25.0000 mg | ORAL_CAPSULE | Freq: Four times a day (QID) | ORAL | Status: DC | PRN

## 2013-11-13 MED ORDER — ONDANSETRON HCL 4 MG/2ML IJ SOLN: 4.0000 mg | INTRAMUSCULAR | Status: DC | PRN

## 2013-11-13 MED ORDER — MEPERIDINE HCL 25 MG/ML IJ SOLN
INTRAMUSCULAR | Status: AC
  Filled 2013-11-13: qty 1

## 2013-11-13 MED ORDER — CEFAZOLIN (ANCEF) 1 G IV SOLR
2.0000 g | INTRAVENOUS | Status: DC
  Filled 2013-11-13: qty 2

## 2013-11-13 MED ORDER — OXYTOCIN 10 UNIT/ML IJ SOLN
INTRAMUSCULAR | Status: AC
  Filled 2013-11-13: qty 4

## 2013-11-13 MED ORDER — TETANUS-DIPHTH-ACELL PERTUSSIS 5-2.5-18.5 LF-MCG/0.5 IM SUSP: 0.5000 mL | Freq: Once | INTRAMUSCULAR | Status: DC

## 2013-11-13 MED ORDER — ONDANSETRON HCL 4 MG/2ML IJ SOLN
INTRAMUSCULAR | Status: AC
  Filled 2013-11-13: qty 2

## 2013-11-13 MED ORDER — FENTANYL CITRATE 0.05 MG/ML IJ SOLN
INTRAMUSCULAR | Status: AC
  Filled 2013-11-13: qty 2

## 2013-11-13 SURGICAL SUPPLY — 35 items
BENZOIN TINCTURE PRP APPL 2/3 (GAUZE/BANDAGES/DRESSINGS) ×2 IMPLANT
CLAMP CORD UMBIL (MISCELLANEOUS) IMPLANT
CLOTH BEACON ORANGE TIMEOUT ST (SAFETY) ×2 IMPLANT
CONTAINER PREFILL 10% NBF 15ML (MISCELLANEOUS) IMPLANT
DRAIN JACKSON PRT FLT 10 (DRAIN) IMPLANT
DRAPE LG THREE QUARTER DISP (DRAPES) IMPLANT
DRSG OPSITE POSTOP 4X10 (GAUZE/BANDAGES/DRESSINGS) ×2 IMPLANT
DURAPREP 26ML APPLICATOR (WOUND CARE) ×2 IMPLANT
ELECT REM PT RETURN 9FT ADLT (ELECTROSURGICAL) ×2
ELECTRODE REM PT RTRN 9FT ADLT (ELECTROSURGICAL) ×1 IMPLANT
EVACUATOR SILICONE 100CC (DRAIN) IMPLANT
EXTRACTOR VACUUM M CUP 4 TUBE (SUCTIONS) IMPLANT
GLOVE BIO SURGEON STRL SZ 6.5 (GLOVE) ×2 IMPLANT
GLOVE BIOGEL PI IND STRL 7.0 (GLOVE) ×1 IMPLANT
GLOVE BIOGEL PI INDICATOR 7.0 (GLOVE) ×1
GOWN STRL REUS W/TWL LRG LVL3 (GOWN DISPOSABLE) ×4 IMPLANT
HEMOSTAT SURGICEL 4X8 (HEMOSTASIS) ×2 IMPLANT
KIT ABG SYR 3ML LUER SLIP (SYRINGE) IMPLANT
NEEDLE HYPO 25X5/8 SAFETYGLIDE (NEEDLE) IMPLANT
NS IRRIG 1000ML POUR BTL (IV SOLUTION) ×2 IMPLANT
PACK C SECTION WH (CUSTOM PROCEDURE TRAY) ×2 IMPLANT
PAD OB MATERNITY 4.3X12.25 (PERSONAL CARE ITEMS) ×2 IMPLANT
RTRCTR C-SECT PINK 25CM LRG (MISCELLANEOUS) IMPLANT
STRIP CLOSURE SKIN 1/2X4 (GAUZE/BANDAGES/DRESSINGS) ×2 IMPLANT
SUT CHROMIC 0 CT 1 (SUTURE) ×2 IMPLANT
SUT MNCRL AB 3-0 PS2 27 (SUTURE) ×2 IMPLANT
SUT PLAIN 2 0 (SUTURE) ×2
SUT PLAIN 2 0 XLH (SUTURE) ×2 IMPLANT
SUT PLAIN ABS 2-0 CT1 27XMFL (SUTURE) ×2 IMPLANT
SUT SILK 2 0 SH (SUTURE) IMPLANT
SUT VIC AB 0 CTX 36 (SUTURE) ×4
SUT VIC AB 0 CTX36XBRD ANBCTRL (SUTURE) ×4 IMPLANT
TOWEL OR 17X24 6PK STRL BLUE (TOWEL DISPOSABLE) ×2 IMPLANT
TRAY FOLEY CATH 14FR (SET/KITS/TRAYS/PACK) ×2 IMPLANT
WATER STERILE IRR 1000ML POUR (IV SOLUTION) ×2 IMPLANT

## 2013-11-13 NOTE — Transfer of Care (Signed)
Immediate Anesthesia Transfer of Care Note  Patient: Caitlin Hall  Procedure(s) Performed: Procedure(s): CESAREAN SECTION (N/A)  Patient Location: PACU  Anesthesia Type:Epidural  Level of Consciousness: awake  Airway & Oxygen Therapy: Patient Spontanous Breathing  Post-op Assessment: Report given to PACU RN and Post -op Vital signs reviewed and stable  Post vital signs: stable  Complications: No apparent anesthesia complications

## 2013-11-13 NOTE — Progress Notes (Signed)
  Subjective: Comfortable with epidural--ready for cervical re-evaluation.  Objective: BP 127/86  Pulse 114  Temp(Src) 98.4 F (36.9 C) (Oral)  Resp 20  Ht 5\' 6"  (1.676 m)  Wt 212 lb (96.163 kg)  BMI 34.23 kg/m2  SpO2 100%  LMP 01/24/2013   Total I/O In: -  Out: 1300 [Urine:1300]  FHT: Category 1 at present--previously short runs of late decels x 2, but resolved with position change and O2.  Moderate variability noted. 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  Patient and family seem to understand these risks and wish to proceed with C/S. Consulted with Dr. Glennie Hawk proceed with prep for C/S.  [name] 11/13/2013, 4:41 PM

## 2013-11-13 NOTE — Progress Notes (Signed)
Patient ID: Sebastian Dzik, female   DOB: August 12, 1990, 23 y.o.   MRN: [id number] Rolla Servidio is a 23 y.o. G1P0000 at [redacted]w[redacted]d admitted for IOL   Subjective: Comfortable w epidural, sleepy after rcv'd benedryl for itching  Called to BS by RN for FHR decel   Objective: BP 107/65  Pulse 86  Temp(Src) 98.2 F (36.8 C) (Oral)  Resp 18  Ht 5\' 6"  (1.676 m)  Wt 212 lb (96.163 kg)  BMI 34.23 kg/m2  SpO2 100%  LMP 01/25/2013     FHT:  Cat 2, FHR decel to 90's/100's over about 5 min, MHR also 100, so difficulty tracing, FSE applied without difficulty  UC:   toco 1-4, pattern becoming more regular, but MVU's not consistently >200   SVE:   Dilation: 4 Effacement (%): 100 Station: -3 Exam by:: Slillard, CNM  vtx slightly lower, but not well applied on cervix, cervix somewhat posterior    Assessment / Plan:  Labor: early labor, no change but ctx not adequate  Preeclampsia:  no s/s. a few isolated elevated BP's Fetal Wellbeing:  Category II Pain Control:  Epidural Anticipated MOD:  undetermined  GBS pos, on PCN Pitocin turned off due to decel and tachysystole IVF bolus, position change FHR improved, now appears reassuring  CTO closely, will monitor MVU's and restart pitocin if appropriate     Update physician PRN   Charlane Ferretti 11/13/2013, 2:33 AM

## 2013-11-13 NOTE — Op Note (Signed)
Cesarean Section Procedure Note   Caitlin Hall  11/11/2013 - 11/13/2013  Indications: failed induction and failure to progress   Pre-operative Diagnosis: failure to progress, failed induction.   Post-operative Diagnosis: Same   Surgeon: Surgeon(s) and Role:    * Betsy Coder, MD - Primary   Assistants: [name] CNM   Anesthesia: epidural   Procedure Details:  The patient was seen in the Holding Room. The risks, benefits, complications, treatment options, and expected outcomes were discussed with the patient. The patient concurred with the proposed plan, giving informed consent. identified as Caitlin Hall and the procedure verified as C-Section Delivery. A Time Out was held and the above information confirmed.  After induction of anesthesia, the patient was draped and prepped in the usual sterile manner. A transverse incision was made and carried down through the subcutaneous tissue to the fascia. Fascial incision was made in the midline and extended transversely. The fascia was separated from the underlying rectus muscle superiorly and inferiorly. The peritoneum was identified and entered. Peritoneal incision was extended longitudinally with good visualization of bowel and bladder. The utero-vesical peritoneal reflection was incised transversely and the bladder flap was bluntly freed from the lower uterine segment.  An alexsis retractor was placed in the abdomen.   A low transverse uterine incision was made. The incision was extended bluntly.   Delivered from cephalic presentation was a  infant, with Apgar scores of 8 at one minute and 9 at five minutes. Cord ph was not sent the umbilical cord was clamped and cut cord blood was obtained for evaluation. The placenta was removed Intact and appeared normal. The uterine outline, tubes and ovaries appeared normal}.the uterus was exteriorized due to the fibroid the blunt opening of the uterine incision caused it to open on an angle going  upward on the pts right side.   The uterine incision was closed with running locked sutures of 0Vicryl. A second layer 0 vicrlyl was used to imbricate the uterine incision.  Two figure of eight suture was placed in the lower uterine segment to obtain hemostasis.  sugicel was placed over the lower uterine segment.      Hemostasis was observed. Lavage was carried out until clear. The alexsis was removed.  The peritoneum was closed with 0 chromic.  The muscles were examined and any bleeders were made hemostatic using bovie cautery device.   The fascia was then reapproximated with running sutures of 0 vicryl.   The subcuticular closure was performed using 3-35monocryl     Instrument, sponge, and needle counts were correct prior the abdominal closure and were correct at the conclusion of the case.    Findings: infant was delivered from vtx presentation. The fluid was stained with light meconium.  The uterus had large varicosities in the lower uterine segment and about a 5 cm fibroid on the left hand side of the lower uterine segment.   tubes and ovaries appeared normal.     Estimated Blood Loss: 516ml   Total IV Fluids: 3513ml   Urine Output: 100CC OF tea colored urine  Specimens: placenta to pathology  Complications: no complications  Disposition: PACU - hemodynamically stable.   Maternal Condition: stable   Baby condition / location:  Couplet care / Skin to Skin  Attending Attestation: I performed the procedure.   Signed: Surgeon(s): Betsy Coder, MD

## 2013-11-13 NOTE — Progress Notes (Signed)
Patient ID: Caitlin Hall, female   DOB: 1990-12-02, 23 y.o.   MRN: [id number] Caitlin Hall is a 23 y.o. G1P0000 at [redacted]w[redacted]d admitted for IOL   Subjective: Comfortable w epidural   Objective: BP 115/75  Pulse 72  Temp(Src) 98.2 F (36.8 C) (Oral)  Resp 18  Ht 5\' 6"  (1.676 m)  Wt 212 lb (96.163 kg)  BMI 34.23 kg/m2  SpO2 100%  LMP 01/25/2013     FHT:  Cat 1 UC:   toco 1-2   SVE:   Dilation: 3.5 Effacement (%): 100 Station: -3 Exam by:: SLillard, CNM    Assessment / Plan:  Labor: early labor, on pitocin  Preeclampsia:  no s/s Fetal Wellbeing:  Category I Pain Control:  Epidural Anticipated MOD:  NSVD  GBS pos, on PCN  Continue pitocin, consider AROM and IUPC w next exam   Update physician PRN   Caitlin Hall 11/13/2013, 12:29 AM

## 2013-11-13 NOTE — Progress Notes (Signed)
Patient ID: Caitlin Hall, female   DOB: [birth year], 23 y.o.   MRN: [id number] Caitlin Hall is a 23 y.o. G1P0000 at [redacted]w[redacted]d admitted for IOL   Subjective: Comfortable w epidural, c/o itching   Objective: BP 115/75  Pulse 72  Temp(Src) 98.2 F (36.8 C) (Oral)  Resp 18  Ht 5\' 6"  (1.676 m)  Wt 212 lb (96.163 kg)  BMI 34.23 kg/m2  SpO2 100%  LMP 01/25/2013     FHT:  Cat 1 UC:   toco 1-5, irreg   SVE:   4/100/-3 vtx, well engaged, BBOW AROM for copious amt light MSAF  IUPC placed without difficulty Attempt to place FSE but was not tracing well, will attempt again w next exam if necessary    Assessment / Plan:  Labor: progressing on pitocin, early labor, pitocin on 68mu  Preeclampsia:  no s/s Fetal Wellbeing:  Category I Pain Control:  Epidural Anticipated MOD:  NSVD  GBS pos, rcv'ing PCN Titrate pitocin for adequate MVU's   DR Charlesetta Garibaldi updated    Lamar Blinks Monick Rena 11/13/2013, 12:24 AM

## 2013-11-13 NOTE — Progress Notes (Signed)
Patient ID: Caitlin Hall, female   DOB: 1991-06-29, 23 y.o.   MRN: [id number] Pt without c/o  BP 139/79  Pulse 107  Temp(Src) 98.8 F (37.1 C) (Oral)  Resp 20  Ht 5\' 6"  (1.676 m)  Wt 96.163 kg (212 lb)  BMI 34.23 kg/m2  SpO2 100%  LMP 01/24/2013 Category 1 NST toco q 2 minutes No cervical change LGA FTP Failed induction Recommend CS pt understands the risks are but limited to bleeding, infection damage to internal organs

## 2013-11-13 NOTE — Progress Notes (Signed)
  Subjective: Comfortable with epidural.  Had question regarding when next VE would occur.  Objective: BP 112/70  Pulse 91  Temp(Src) 98.2 F (36.8 C) (Oral)  Resp 20  Ht 5\' 6"  (1.676 m)  Wt 212 lb (96.163 kg)  BMI 34.23 kg/m2  SpO2 100%  LMP 01/24/2013   Total I/O In: -  Out: 1300 [Urine:1300]  FHT: Category 1 UC:   q 2-4 min, moderate SVE:   Deferred at present MVUs 80-110 Pitocin at 10 mu/min  Assessment:  Induction for postdates LGA GBS positive Inadequate contractions  Plan: Reviewed questions with patient and husband.  Discussed persistent lack of adequate contractions, with decision for VE depending on whether findings would make a change in the plan of care.  Discussed reassuring status of FHR. Reviewed questions regarding C/S, including R&B--bleeding, infection, damage to other organs, VBAC opportunity in subsequent pregnancy. Patient requests VE around 12:15-12:30 for evaluation of status.  She may want to consider C/S if no change on next exam. Will update Dr. Mancel Bale with next exam.  [name] 11/13/2013, 11:31 AM

## 2013-11-13 NOTE — Progress Notes (Signed)
  Subjective: Comfortable with epidural.  Has been tearful since last discussion regarding possibility of C/S.  Objective: BP 138/91  Pulse 106  Temp(Src) 98.4 F (36.9 C) (Oral)  Resp 20  Ht 5\' 6"  (1.676 m)  Wt 212 lb (96.163 kg)  BMI 34.23 kg/m2  SpO2 100%  LMP 01/24/2013   Total I/O In: -  Out: 1300 [Urine:1300]  Filed Vitals:   11/13/13 1131 11/13/13 1201 11/13/13 1232 11/13/13 1301  BP: 109/87 123/81 132/92 138/91  Pulse: 116 87 106 106  Temp: 98.4 F (36.9 C)  98.4 F (36.9 C)   TempSrc: Oral  Oral   Resp: 18 18 20 20   Height:      Weight:      SpO2:         FHT: Category 1 overall--single late decel noted with patient on back UC:   regular, every 2-3 minutes SVE:   Dilation: 6 Effacement (%): 100 Station: -2 Exam by:: [name], CNM Vtx slightly lower in pelvis--no molding, cervix very stretchy, not edematous Copious fluid noted on exam, moderate MSF MVUs 125 Pitocin at 11 mu/min IUPC replaced to ensure accurate UC monitoring.  Assessment:  Progressive labor--day 2 induction LGA GBS positive MSF  Plan: Continue augmentation to achieve adequacy. Close observation of maternal/fetal status. Dr. Mancel Bale updated.  Caitlin Hall 11/13/2013,1230

## 2013-11-13 NOTE — Anesthesia Postprocedure Evaluation (Signed)
  Anesthesia Post-op Note  Patient: Caitlin Hall  Procedure(s) Performed: Procedure(s): CESAREAN SECTION (N/A)  Patient Location: PACU  Anesthesia Type:Epidural  Level of Consciousness: awake, alert  and oriented  Airway and Oxygen Therapy: Patient Spontanous Breathing  Post-op Pain: none  Post-op Assessment: Post-op Vital signs reviewed, Patient's Cardiovascular Status Stable, Respiratory Function Stable, Patent Airway, No signs of Nausea or vomiting, Pain level controlled, No headache, No backache, No residual numbness and No residual motor weakness  Post-op Vital Signs: Reviewed and stable  Last Vitals:  Filed Vitals:   11/13/13 2030  BP:   Pulse: 100  Temp: 37.2 C  Resp: 17    Complications: No apparent anesthesia complications

## 2013-11-13 NOTE — Progress Notes (Signed)
  Subjective: Comfortable with epidural.  Family at bedside.  Objective: BP 120/83  Pulse 99  Temp(Src) 98.8 F (37.1 C) (Oral)  Resp 18  Ht 5\' 6"  (1.676 m)  Wt 212 lb (96.163 kg)  BMI 34.23 kg/m2  SpO2 100%  LMP 01/24/2013   Total I/O In: -  Out: 1300 [Urine:1300]  FHT: Category 1 UC:   regular, every 2-4 minutes SVE:   Dilation: 4 Effacement (%): 100 Station: -2 Exam by:: [name], CNM Pitocin at 3 mu/min MVUs 90  Assessment:  Induction for postdates LGA GBS positive Mild/moderate MSF  Plan: Reviewed plan of care with patient and family--discussed need to continue to try to achieve adequate labor to maximize opportunity for progress in dilation and descent. Discussed possibility of CPD/FTP and fetal intolerance to labor, but advised no clear dx at this point--will continue to observe closely. Would avoid operative vaginal delivery, due to likely LGA.  [name] 11/13/2013, 7:42 AM

## 2013-11-13 NOTE — Progress Notes (Signed)
Patient ID: Caitlin Hall, female   DOB: 1991-02-20, 23 y.o.   MRN: [id number] Caitlin Hall is a 23 y.o. G1P0000 at [redacted]w[redacted]d admitted for IOL   Subjective: Comfortable w epidural, has been sleeping   Objective: BP 122/70  Pulse 98  Temp(Src) 98.8 F (37.1 C) (Oral)  Resp 18  Ht 5\' 6"  (1.676 m)  Wt 212 lb (96.163 kg)  BMI 34.23 kg/m2  SpO2 100%  LMP 01/25/2013     FHT:  Cat 1, has had occ periods of decreased variability, and occ mild variable,  UC:   toco 2-3, mild intensity, MVU's about 100  SVE:   Dilation: 4 Effacement (%): 100 Station: -2 Exam by:: Lamonte Sakai, RN    Assessment / Plan:  Labor: early labor Preeclampsia:  no s/s Fetal Wellbeing:  Category I Pain Control:  Epidural Anticipated MOD:  undetermined   GBS pos, on PCN Pitocin off since about 230am, due to decels,  Spontaneous ctx, initially appeared to be adequate, now decreasing  At about 0300, had prolonged decel to 90's w spontaneous return to BL at about 3 minutes,  BL has been low normal about 120 Amnioinfusion was started Continues to have lg amt fluid, appears to be mod MSAF  Will restart pitocin now, and CTO closely    Update physician PRN   Charlane Ferretti 11/13/2013, 5:53 AM

## 2013-11-14 ENCOUNTER — Encounter (HOSPITAL_COMMUNITY): Payer: Self-pay | Admitting: Obstetrics and Gynecology

## 2013-11-14 LAB — CBC
HEMATOCRIT: 21.6 % — AB (ref 36.0–46.0)
Hemoglobin: 7.5 g/dL — ABNORMAL LOW (ref 12.0–15.0)
MCH: 23.1 pg — ABNORMAL LOW (ref 26.0–34.0)
MCHC: 34.7 g/dL (ref 30.0–36.0)
MCV: 66.7 fL — ABNORMAL LOW (ref 78.0–100.0)
Platelets: 203 10*3/uL (ref 150–400)
RBC: 3.24 MIL/uL — AB (ref 3.87–5.11)
RDW: 16.7 % — ABNORMAL HIGH (ref 11.5–15.5)
WBC: 13.1 10*3/uL — AB (ref 4.0–10.5)

## 2013-11-14 MED ORDER — DIPHENHYDRAMINE HCL 25 MG PO CAPS
50.0000 mg | ORAL_CAPSULE | ORAL | Status: DC | PRN
  Administered 2013-11-14: 50 mg via ORAL
  Filled 2013-11-14: qty 2

## 2013-11-14 NOTE — Anesthesia Postprocedure Evaluation (Signed)
  Anesthesia Post-op Note  Patient: Caitlin Hall  Procedure(s) Performed: Procedure(s): CESAREAN SECTION (N/A)  Patient Location: Mother/Baby  Anesthesia Type:Epidural  Level of Consciousness: awake  Airway and Oxygen Therapy: Patient Spontanous Breathing  Post-op Pain: mild  Post-op Assessment: Patient's Cardiovascular Status Stable and Respiratory Function Stable  Post-op Vital Signs: stable  Last Vitals:  Filed Vitals:   11/14/13 0205  BP: 116/72  Pulse: 103  Temp: 37.1 C  Resp: 20    Complications: No apparent anesthesia complications

## 2013-11-14 NOTE — Progress Notes (Signed)
Arville Go CNM called in L&D regarding patients elevated pulse rate and decreased output. Currently in delivery at this time so information given to [facility] per L&D nurse. Requested Lollie Marrow to assess patient when able. Patients fundus firm and currently at U with small lochia. Output increasing but HR remains 100-115. Patient able to ambulate with minimal assistance to bathroom and tolerated activity without problems. Moderate drainage at incisional site but 0 increase since last 2 assessments completed.

## 2013-11-14 NOTE — Progress Notes (Signed)
Subjective: Postpartum Day 1: Primary Cesarean Delivery due to Failure to Progress Patient up ad lib, reports no syncope or dizziness. Patient denies flatulence, bm, and N/V Reports consuming regular diet without issues Feeding:  Breast Contraceptive plan:  Considering Nexplanon  Objective: Vital signs in last 24 hours: Temp:  [97.5 F (36.4 C)-99.8 F (37.7 C)] 98.8 F (37.1 C) (04/13 0905) Pulse Rate:  [85-130] 121 (04/13 0913) Resp:  [16-22] 20 (04/13 0905) BP: (103-139)/(63-100) 119/71 mmHg (04/13 0913) SpO2:  [99 %-100 %] 100 % (04/13 0905)  Physical Exam:  General: alert, cooperative and no distress Lochia: appropriate Uterine Fundus: firm, U/-2 Abdomen: Soft, NT at fundus, Nondistended Incision: Honeycomb CDI DVT Evaluation: No evidence of DVT seen on physical exam. Negative Homan's sign. JP drain:   N/A   Recent Labs  11/11/13 2005 11/14/13 0545  HGB 10.7* 7.5*  HCT 30.7* 21.6*  WBC 9.6 13.1*    Assessment: S/P Primary LTCS Normal Involution  Breastfeeding Asymptomatic Anemia  Plan: -Orthostatic Vital Signs -Fe+ supplementation 325mg  BID -CBC in am to reassess -Discussed options, risk, and benefits of blood transfusion-declines at current -Continue present mgmt  Francetta Found [facility] 11/14/2013, 9:53 AM

## 2013-11-14 NOTE — Progress Notes (Signed)
S: Patient reporting lip swelling and itching.  Continues to deny flatulence, bm, and n/v.  States has no history or known allergies and has not consumed any new foods.  O: Angioedema of the lower lip-localized to left side.  No issues with swallowing or articulation noted. No rash noted on stomach or back. [id number].  No distention noted.  A: S/P C/S Day 1 Angioedema  P: Benadryl PO Continue to assess swelling and report any increase Increase hydration and ambulation to promote GI return Continue other mgmt   Gavin Pound, CNM, MSN

## 2013-11-14 NOTE — Progress Notes (Signed)
In to see patient to check on status.  Getting ready to walk in hall--has walked earlier today.   Denies N/V, fever, HA, or any other sx. Swelling of lower lip on left side still present, but no worse. No evidence trauma, bite, lesion--appears c/w fluid accumulation in tissue. No general lip edema. On Benadryl 50 mg po q 4 hours No flatus yet. Abdomen noted to be tympanic, with gaseous distension.  Filed Vitals:   11/14/13 0908 11/14/13 0913 11/14/13 1200 11/14/13 1630  BP: 105/65 119/71 111/70 105/65  Pulse: 96 121 104 101  Temp:   97.9 F (36.6 C) 98.7 F (37.1 C)  TempSrc:   Axillary Oral  Resp:   20 18  Height:      Weight:      SpO2:   98%    Will CTO at present. If no flatus by the am, will consider Dulcolax suppository. If patient doing well tomorrow, she may want to be considered for early d/c.  I advised her we would need to see improvement in her GI status prior to d/c.  [name], CNM 11/14/13 8:15p

## 2013-11-14 NOTE — Addendum Note (Signed)
Addendum created 11/14/13 0737 by Ignacia Bayley, CRNA   Modules edited: Charges VN, Notes Section   Notes Section:  File: [id number]

## 2013-11-14 NOTE — Lactation Note (Signed)
This note was copied from the chart of Caitlin Alonzo Loving. Lactation Consultation Note Flat nipple, heavy pendulum breast w/pitting edema to posterior breast. Nipples totally compress inwards. Hand pump w/colostrum. Hand expression demonstrated. #24 NS given, #20 fits better. Shells for AM to wear in Bra. Encouraged to pre-pump breast until see colostrum, apply NS before nursing. Elevated breast on wash cloth for support. Baby nursed well and eager. Heard occasional swallows. [place] brochure given w/resources, support groups and [place] services.Referred to Baby and Me Book in Breastfeeding section Pg. 22-23 for position options and Proper latch demonstration.Encouraged comfort during BF so colostrum flows better and mom will enjoy the feeding longer. Taking deep breaths and breast massage during BF. Encouraged to call for assistance if needed and to verify proper latch. Patient Name: Caitlin Hall Date: 11/14/2013 Reason for consult: Initial assessment;Difficult latch   Maternal Data    Feeding Feeding Type: Breast Fed Length of feed: 20 min  LATCH Score/Interventions Latch: Repeated attempts needed to sustain latch, nipple held in mouth throughout feeding, stimulation needed to elicit sucking reflex. Intervention(s): Adjust position;Assist with latch;Breast massage;Breast compression  Audible Swallowing: A few with stimulation  Type of Nipple: Flat (edema) Intervention(s): Shells;Hand pump;Reverse pressure (NS gave #24, #20)  Comfort (Breast/Nipple): Soft / non-tender     Hold (Positioning): Assistance needed to correctly position infant at breast and maintain latch.  LATCH Score: 6  Lactation Tools Discussed/Used Tools: Shells;Nipple [name];Pump Nipple shield size: 20;24 Shell Type: Inverted Breast pump type: Manual   Consult Status Consult Status: Follow-up Date: 11/14/13 Follow-up type: In-patient    Theodoro Kalata 11/14/2013, 12:54 AM

## 2013-11-14 NOTE — Progress Notes (Signed)
Ur chart review completed.  

## 2013-11-14 NOTE — Progress Notes (Signed)
[name] called in L&D regarding patient status in delivery at this time. Message left to call [facility] when able.

## 2013-11-15 LAB — CBC
HCT: 21.1 % — ABNORMAL LOW (ref 36.0–46.0)
HEMOGLOBIN: 7.3 g/dL — AB (ref 12.0–15.0)
MCH: 23.2 pg — AB (ref 26.0–34.0)
MCHC: 34.6 g/dL (ref 30.0–36.0)
MCV: 67 fL — ABNORMAL LOW (ref 78.0–100.0)
Platelets: 210 10*3/uL (ref 150–400)
RBC: 3.15 MIL/uL — ABNORMAL LOW (ref 3.87–5.11)
RDW: 16.9 % — AB (ref 11.5–15.5)
WBC: 13.8 10*3/uL — ABNORMAL HIGH (ref 4.0–10.5)

## 2013-11-15 NOTE — Lactation Note (Addendum)
This note was copied from the chart of Caitlin Hall. Lactation Consultation Note Follow up consult:  Baby has not breastfed since 0511 this morning. Reviewed with parents that baby needs to breastfeed 8-12 times a day for more than 10 min. Reviewed how to wake baby up to breastfeed. Mother demonstrated hand expression.  In football position, attempted breastfeeding without the NS.  Baby would not sustain latch. Assisted mother with #20 NS, baby latched easily, intermittent sucks observed.  Small drops of colostrum in shield. Encouraged mother to post pump 4-6 times a day for 15 min. to stimulate her breasts and encourage breastmilk flow.     Patient Name: Caitlin Hall [id number] Date: 11/15/2013 Reason for consult: Follow-up assessment   Maternal Data Has patient been taught Hand Expression?: Yes  Feeding Feeding Type: Breast Fed  LATCH Score/Interventions                      Lactation Tools Discussed/Used     Consult Status Consult Status: Follow-up Date: 11/16/13 Follow-up type: In-patient    Larry Sierras Berkelhammer 11/15/2013, 12:04 PM

## 2013-11-15 NOTE — Progress Notes (Signed)
In to assess patient.  Currently in bathroom.  Requests that provider return at later time.  Gavin Pound, CNM, MSN

## 2013-11-15 NOTE — Progress Notes (Signed)
Subjective: Postpartum Day 2: Primary Cesarean Delivery due to Failure to Progress Patient up ad lib, reports no syncope or dizziness. Patient reports flatulence, bm, and denies N/V Reports consuming regular diet without issues Feeding:  Breast Contraceptive plan:  Considering Nexplanon  Objective: Vital signs in last 24 hours: Temp:  [98.5 F (36.9 C)-98.7 F (37.1 C)] 98.5 F (36.9 C) (04/14 0657) Pulse Rate:  [96-101] 96 (04/14 0657) Resp:  [18] 18 (04/14 0657) BP: (105-121)/(65-79) 121/79 mmHg (04/14 0657) SpO2:  [97 %] 97 % (04/14 0657)  Physical Exam:  General: alert, cooperative and no distress Lochia: appropriate Uterine Fundus: firm, U/-2 Abdomen: Soft, NT at fundus, Distended, BSx4Q Incision: Honeycomb CDI DVT Evaluation: No evidence of DVT seen on physical exam. Negative Homan's sign. JP drain:   N/A   Recent Labs  11/14/13 0545 11/15/13 0555  HGB 7.5* 7.3*  HCT 21.6* 21.1*  WBC 13.1* 13.8*    Assessment: S/P Primary LTCS Normal Involution  Breastfeeding Asymptomatic Anemia  Plan: -Discussed discharge medications -Plan for discharge tomorrow -Continue present mgmt  Sedalia Muta., CNM, MSN  11/15/2013, 1:40 PM

## 2013-11-16 MED ORDER — SIMETHICONE 80 MG PO CHEW: 80.0000 mg | CHEWABLE_TABLET | ORAL | Status: DC | PRN

## 2013-11-16 MED ORDER — IBUPROFEN 600 MG PO TABS: 600.0000 mg | ORAL_TABLET | Freq: Four times a day (QID) | ORAL | Status: DC | PRN

## 2013-11-16 MED ORDER — OXYCODONE-ACETAMINOPHEN 5-325 MG PO TABS: 1.0000 | ORAL_TABLET | ORAL | Status: DC | PRN

## 2013-11-16 MED ORDER — FERROUS SULFATE 325 (65 FE) MG PO TABS: 325.0000 mg | ORAL_TABLET | Freq: Two times a day (BID) | ORAL | Status: DC

## 2013-11-16 MED ORDER — DOCUSATE SODIUM 100 MG PO CAPS: 100.0000 mg | ORAL_CAPSULE | Freq: Two times a day (BID) | ORAL | Status: DC

## 2013-11-16 NOTE — Discharge Summary (Signed)
Cesarean Section Delivery Discharge Summary  Caitlin Hall  DOB:    06/13/1991 MRN:    [id number] CSN:    [id number]  Date of admission:                  11/11/2013  Date of discharge:                   11/16/2013  Procedures this admission: Primary LTCS for Failure to Progress  Date of Delivery: 11/13/2013  Newborn Data:  Live born female  Birth Weight: 8 lb 9.6 oz (3901 g) APGAR: 8, 9  Home with mother. Name: Chloe Circumcision Plan: N/a  History of Present Illness:  Ms. Caitlin Hall is a 23 y.o. female, G1P1001, who presents at [redacted]w[redacted]d weeks gestation. The patient has been followed at the [facility] and Gynecology division of [facility].    Her pregnancy has been complicated by:  Patient Active Problem List   Diagnosis Date Noted  . Normal labor 11/11/2013  . 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  Hospital course:  The patient was admitted for induction of labor for postdates.   Her postpartum course was not complicated. However, patient did have an incidence of angioedema, but contributing factor (meds, food, etc) could not be identified. She was discharged to home on postpartum day 3 doing well.  Feeding:  breast  Contraception:  cervical cap  Discharge hemoglobin:  Hemoglobin  Date Value Ref Range Status  11/15/2013 7.3* 12.0 - 15.0 g/dL Final     HCT  Date Value Ref Range Status  11/15/2013 21.1* 36.0 - 46.0 % Final    Discharge Physical Exam:   General: alert, cooperative and no distress Lochia: appropriate Uterine Fundus: firm Incision: Honeycomb dressing replaced.  Patient instructed to remove in 2-3 days and allow steri-strips to fall off or remove in 7days. DVT Evaluation: No evidence of DVT seen on physical exam. Negative Homan's sign.  Intrapartum Procedures: cesarean: low cervical, transverse Postpartum Procedures: none Complications-Operative and Postpartum: none  Discharge Diagnoses:  Post-date pregnancy, delivered  Discharge Information:  Activity:           pelvic rest Diet:                routine Medications: Ibuprofen, Colace, Iron and Percocet Condition:      stable Instructions:  Care After Cesarean Delivery  Refer to this sheet in the next few weeks. These instructions provide you with information on caring for yourself after your procedure. Your caregiver may also give you specific instructions. Your treatment has been planned according to current medical practices, but problems sometimes occur. Call your caregiver if you have any problems or questions after you go home. HOME CARE INSTRUCTIONS  Only take over-the-counter or prescription medicines as directed by your caregiver.  Do not drink alcohol, especially if you are breastfeeding or taking medicine to relieve pain.  Do not chew or smoke tobacco.  Continue to use good perineal care. Good perineal care includes:  Wiping your perineum from front to back.  Keeping your perineum clean.  Check your cut (incision) daily for increased redness, drainage, swelling, or separation of skin.  Clean your incision gently with soap and water every day, and then pat it dry. If your caregiver says it is okay, leave the incision uncovered. Use a bandage (dressing) if the incision is draining fluid or appears irritated. If the adhesive strips across the incision do not fall off within 7 days,  carefully peel them off.  Hug a pillow when coughing or sneezing until your incision is healed. This helps to relieve pain.  Do not use tampons or douche until your caregiver says it is okay.  Shower, wash your hair, and take tub baths as directed by your caregiver.  Wear a well-fitting bra that provides breast support.  Limit wearing support panties or control-top hose.  Drink enough fluids to keep your urine clear or pale yellow.  Eat high-fiber foods such as whole grain cereals and breads, brown rice, beans, and fresh  fruits and vegetables every day. These foods may help prevent or relieve constipation.  Resume activities such as climbing stairs, driving, lifting, exercising, or traveling as directed by your caregiver.  Talk to your caregiver about resuming sexual activities. This is dependent upon your risk of infection, your rate of healing, and your comfort and desire to resume sexual activity.  Try to have someone help you with your household activities and your newborn for at least a few days after you leave the hospital.  Rest as much as possible. Try to rest or take a nap when your newborn is sleeping.  Increase your activities gradually.  Keep all of your scheduled postpartum appointments. It is very important to keep your scheduled follow-up appointments. At these appointments, your caregiver will be checking to make sure that you are healing physically and emotionally. SEEK MEDICAL CARE IF:   You are passing large clots from your vagina. Save any clots to show your caregiver.  You have a foul smelling discharge from your vagina.  You have trouble urinating.  You are urinating frequently.  You have pain when you urinate.  You have a change in your bowel movements.  You have increasing redness, pain, or swelling near your incision.  You have pus draining from your incision.  Your incision is separating.  You have painful, hard, or reddened breasts.  You have a severe headache.  You have blurred vision or see spots.  You feel sad or depressed.  You have thoughts of hurting yourself or your newborn.  You have questions about your care, the care of your newborn, or medicines.  You are dizzy or lightheaded.  You have a rash.  You have pain, redness, or swelling at the site of the removed intravenous access (IV) tube.  You have nausea or vomiting.  You stopped breastfeeding and have not had a menstrual period within 12 weeks of stopping.  You are not breastfeeding and have  not had a menstrual period within 12 weeks of delivery.  You have a fever. SEEK IMMEDIATE MEDICAL CARE IF:  You have persistent pain.  You have chest pain.  You have shortness of breath.  You faint.  You have leg pain.  You have stomach pain.  Your vaginal bleeding saturates 2 or more sanitary pads in 1 hour. MAKE SURE YOU:   Understand these instructions.  Will watch your condition.  Will get help right away if you are not doing well or get worse. Document Released: 04/12/2002 Document Revised: 04/14/2012 Document Reviewed: 03/17/2012 [facility] Patient Information 2014 [place].   Postpartum Depression and Baby Blues  The postpartum period begins right after the birth of a baby. During this time, there is often a great amount of joy and excitement. It is also a time of considerable changes in the life of the parent(s). Regardless of how many times a mother gives birth, each child brings new challenges and dynamics to the  family. It is not unusual to have feelings of excitement accompanied by confusing shifts in moods, emotions, and thoughts. All mothers are at risk of developing postpartum depression or the "baby blues." These mood changes can occur right after giving birth, or they may occur many months after giving birth. The baby blues or postpartum depression can be mild or severe. Additionally, postpartum depression can resolve rather quickly, or it can be a long-term condition. CAUSES Elevated hormones and their rapid decline are thought to be a main cause of postpartum depression and the baby blues. There are a number of hormones that radically change during and after pregnancy. Estrogen and progesterone usually decrease immediately after delivering your baby. The level of thyroid hormone and various cortisol steroids also rapidly drop. Other factors that play a major role in these changes include major life events and genetics.  RISK FACTORS If you have any of the  following risks for the baby blues or postpartum depression, know what symptoms to watch out for during the postpartum period. Risk factors that may increase the likelihood of getting the baby blues or postpartum depression include:  Havinga personal or family history of depression.  Having depression while being pregnant.  Having premenstrual or oral contraceptive-associated mood issues.  Having exceptional life stress.  Having marital conflict.  Lacking a social support network.  Having a baby with special needs.  Having health problems such as diabetes. SYMPTOMS Baby blues symptoms include:  Brief fluctuations in mood, such as going from extreme happiness to sadness.  Decreased concentration.  Difficulty sleeping.  Crying spells, tearfulness.  Irritability.  Anxiety. Postpartum depression symptoms typically begin within the first month after giving birth. These symptoms include:  Difficulty sleeping or excessive sleepiness.  Marked weight loss.  Agitation.  Feelings of worthlessness.  Lack of interest in activity or food. Postpartum psychosis is a very concerning condition and can be dangerous. Fortunately, it is rare. Displaying any of the following symptoms is cause for immediate medical attention. Postpartum psychosis symptoms include:  Hallucinations and delusions.  Bizarre or disorganized behavior.  Confusion or disorientation. DIAGNOSIS  A diagnosis is made by an evaluation of your symptoms. There are no medical or lab tests that lead to a diagnosis, but there are various questionnaires that a caregiver may use to identify those with the baby blues, postpartum depression, or psychosis. Often times, a screening tool called the [place] Postnatal Depression Scale is used to diagnose depression in the postpartum period.  TREATMENT The baby blues usually goes away on its own in 1 to 2 weeks. Social support is often all that is needed. You should be  encouraged to get adequate sleep and rest. Occasionally, you may be given medicines to help you sleep.  Postpartum depression requires treatment as it can last several months or longer if it is not treated. Treatment may include individual or group therapy, medicine, or both to address any social, physiological, and psychological factors that may play a role in the depression. Regular exercise, a healthy diet, rest, and social support may also be strongly recommended.  Postpartum psychosis is more serious and needs treatment right away. Hospitalization is often needed. HOME CARE INSTRUCTIONS  Get as much rest as you can. Nap when the baby sleeps.  Exercise regularly. Some women find yoga and walking to be beneficial.  Eat a balanced and nourishing diet.  Do little things that you enjoy. Have a cup of tea, take a bubble bath, read your favorite magazine, or listen to  your favorite music.  Avoid alcohol.  Ask for help with household chores, cooking, grocery shopping, or running errands as needed. Do not try to do everything.  Talk to people close to you about how you are feeling. Get support from your partner, family members, friends, or other new moms.  Try to stay positive in how you think. Think about the things you are grateful for.  Do not spend a lot of time alone.  Only take medicine as directed by your caregiver.  Keep all your postpartum appointments.  Let your caregiver know if you have any concerns. SEEK MEDICAL CARE IF: You are having a reaction or problems with your medicine. SEEK IMMEDIATE MEDICAL CARE IF:  You have suicidal feelings.  You feel you may harm the baby or someone else. Document Released: 04/24/2004 Document Revised: 10/13/2011 Document Reviewed: 05/27/2011 [facility] Patient Information 2014 [place], [place].  Discharge to: home  Follow-up Information   Follow up with [facility]. Schedule an appointment as soon as possible  for a visit in 5 weeks. (Please call if you have any issues or concerns prior to your visit. )    Specialty:  Obstetrics and Gynecology   Contact information:   [address] [address] [phone]       Sedalia Muta , [place], MSN  11/16/2013

## 2013-11-16 NOTE — Lactation Note (Signed)
This note was copied from the chart of Caitlin Marvelous [place]. Lactation Consultation Note Mother has short shaft nipples.  Left sore, right cracked.  Mother's milk is transitioning.   Assisted mother with cross cradle hold.  Baby latched easily, sucks and swallows observed for 30 min.  Breastmilk in NS.  #24 NS is more comfortable for mother. Mother is using DEBP.  Reviewed milk storage and volume guidelines.  Mother was able to pump 30 ml.  Parents giving breastmilk in bottle to baby. Reviewed engorgement care.  Mother has her own DEBP and has an appt. with LC at [facility] on Friday. Patient Name: Caitlin Hall [id number] Date: 11/16/2013 Reason for consult: Follow-up assessment   Maternal Data    Feeding Feeding Type: Breast Fed Length of feed: 30 min  LATCH Score/Interventions Latch: Grasps breast easily, tongue down, lips flanged, rhythmical sucking. Intervention(s): Waking techniques Intervention(s): Adjust position;Assist with latch;Breast massage  Audible Swallowing: Spontaneous and intermittent  Type of Nipple: Everted at rest and after stimulation (short shaft) Intervention(s): Shells;Hand pump;Double electric pump  Comfort (Breast/Nipple): Filling, red/small blisters or bruises, mild/mod discomfort  Problem noted: Cracked, bleeding, blisters, bruises Interventions  (Cracked/bleeding/bruising/blister): Expressed breast milk to nipple  Hold (Positioning): Assistance needed to correctly position infant at breast and maintain latch.  LATCH Score: 8  Lactation Tools Discussed/Used Tools: Shells;Nipple Shields;Pump;Comfort gels Nipple shield size: 20;24 Shell Type: Sore Breast pump type: Manual Pump Review: Milk Storage   Consult Status Consult Status: Complete    Larry Sierras Berkelhammer 11/16/2013, 11:05 AM

## 2013-11-16 NOTE — Discharge Instructions (Signed)
Etonogestrel implant What is this medicine? ETONOGESTREL (et oh noe JES trel) is a contraceptive (birth control) device. It is used to prevent pregnancy. It can be used for up to 3 years. This medicine may be used for other purposes; ask your health care provider or pharmacist if you have questions. COMMON BRAND NAME(S): Implanon, Nexplanon  What should I tell my health care provider before I take this medicine? They need to know if you have any of these conditions: -abnormal vaginal bleeding -blood vessel disease or blood clots -cancer of the breast, cervix, or liver -depression -diabetes -gallbladder disease -headaches -heart disease or recent heart attack -high blood pressure -high cholesterol -kidney disease -liver disease -renal disease -seizures -tobacco smoker -an unusual or allergic reaction to etonogestrel, other hormones, anesthetics or antiseptics, medicines, foods, dyes, or preservatives -pregnant or trying to get pregnant -breast-feeding How should I use this medicine? This device is inserted just under the skin on the inner side of your upper arm by a health care professional. Talk to your pediatrician regarding the use of this medicine in children. Special care may be needed. Overdosage: If you think you've taken too much of this medicine contact a poison control center or emergency room at once. Overdosage: If you think you have taken too much of this medicine contact a poison control center or emergency room at once. NOTE: This medicine is only for you. Do not share this medicine with others. What if I miss a dose? This does not apply. What may interact with this medicine? Do not take this medicine with any of the following medications: -amprenavir -bosentan -fosamprenavir This medicine may also interact with the following medications: -barbiturate medicines for inducing sleep or treating seizures -certain medicines for fungal infections like ketoconazole and  itraconazole -griseofulvin -medicines to treat seizures like carbamazepine, felbamate, oxcarbazepine, phenytoin, topiramate -modafinil -phenylbutazone -rifampin -some medicines to treat HIV infection like atazanavir, indinavir, lopinavir, nelfinavir, tipranavir, ritonavir -St. John's wort This list may not describe all possible interactions. Give your health care provider a list of all the medicines, herbs, non-prescription drugs, or dietary supplements you use. Also tell them if you smoke, drink alcohol, or use illegal drugs. Some items may interact with your medicine. What should I watch for while using this medicine? This product does not protect you against HIV infection (AIDS) or other sexually transmitted diseases. You should be able to feel the implant by pressing your fingertips over the skin where it was inserted. Tell your doctor if you cannot feel the implant. What side effects may I notice from receiving this medicine? Side effects that you should report to your doctor or health care professional as soon as possible: -allergic reactions like skin rash, itching or hives, swelling of the face, lips, or tongue -breast lumps -changes in vision -confusion, trouble speaking or understanding -dark urine -depressed mood -general ill feeling or flu-like symptoms -light-colored stools -loss of appetite, nausea -right upper belly pain -severe headaches -severe pain, swelling, or tenderness in the abdomen -shortness of breath, chest pain, swelling in a leg -signs of pregnancy -sudden numbness or weakness of the face, arm or leg -trouble walking, dizziness, loss of balance or coordination -unusual vaginal bleeding, discharge -unusually weak or tired -yellowing of the eyes or skin Side effects that usually do not require medical attention (Report these to your doctor or health care professional if they continue or are bothersome.): -acne -breast pain -changes in  weight -cough -fever or chills -headache -irregular menstrual bleeding -itching, burning,  and vaginal discharge -pain or difficulty passing urine -sore throat This list may not describe all possible side effects. Call your doctor for medical advice about side effects. You may report side effects to FDA at 1-800-FDA-1088. Where should I keep my medicine? This drug is given in a hospital or clinic and will not be stored at home. NOTE: This sheet is a summary. It may not cover all possible information. If you have questions about this medicine, talk to your doctor, pharmacist, or health care provider.  2014, [employer]/Gold Standard. (2012-01-26 15:37:45) Postpartum Depression and Baby Blues The postpartum period begins right after the birth of a baby. During this time, there is often a great amount of joy and excitement. It is also a time of considerable changes in the life of the parent(s). Regardless of how many times a mother gives birth, each child brings new challenges and dynamics to the family. It is not unusual to have feelings of excitement accompanied by confusing shifts in moods, emotions, and thoughts. All mothers are at risk of developing postpartum depression or the "baby blues." These mood changes can occur right after giving birth, or they may occur many months after giving birth. The baby blues or postpartum depression can be mild or severe. Additionally, postpartum depression can resolve rather quickly, or it can be a long-term condition. CAUSES Elevated hormones and their rapid decline are thought to be a main cause of postpartum depression and the baby blues. There are a number of hormones that radically change during and after pregnancy. Estrogen and progesterone usually decrease immediately after delivering your baby. The level of thyroid hormone and various cortisol steroids also rapidly drop. Other factors that play a major role in these changes include major life events and  genetics.  RISK FACTORS If you have any of the following risks for the baby blues or postpartum depression, know what symptoms to watch out for during the postpartum period. Risk factors that may increase the likelihood of getting the baby blues or postpartum depression include:  Havinga personal or family history of depression.  Having depression while being pregnant.  Having premenstrual or oral contraceptive-associated mood issues.  Having exceptional life stress.  Having marital conflict.  Lacking a social support network.  Having a baby with special needs.  Having health problems such as diabetes. SYMPTOMS Baby blues symptoms include:  Brief fluctuations in mood, such as going from extreme happiness to sadness.  Decreased concentration.  Difficulty sleeping.  Crying spells, tearfulness.  Irritability.  Anxiety. Postpartum depression symptoms typically begin within the first month after giving birth. These symptoms include:  Difficulty sleeping or excessive sleepiness.  Marked weight loss.  Agitation.  Feelings of worthlessness.  Lack of interest in activity or food. Postpartum psychosis is a very concerning condition and can be dangerous. Fortunately, it is rare. Displaying any of the following symptoms is cause for immediate medical attention. Postpartum psychosis symptoms include:  Hallucinations and delusions.  Bizarre or disorganized behavior.  Confusion or disorientation. DIAGNOSIS  A diagnosis is made by an evaluation of your symptoms. There are no medical or lab tests that lead to a diagnosis, but there are various questionnaires that a caregiver may use to identify those with the baby blues, postpartum depression, or psychosis. Often times, a screening tool called the [place] Postnatal Depression Scale is used to diagnose depression in the postpartum period.  TREATMENT The baby blues usually goes away on its own in 1 to 2 weeks. Social support is  often  all that is needed. You should be encouraged to get adequate sleep and rest. Occasionally, you may be given medicines to help you sleep.  Postpartum depression requires treatment as it can last several months or longer if it is not treated. Treatment may include individual or group therapy, medicine, or both to address any social, physiological, and psychological factors that may play a role in the depression. Regular exercise, a healthy diet, rest, and social support may also be strongly recommended.  Postpartum psychosis is more serious and needs treatment right away. Hospitalization is often needed. HOME CARE INSTRUCTIONS  Get as much rest as you can. Nap when the baby sleeps.  Exercise regularly. Some women find yoga and walking to be beneficial.  Eat a balanced and nourishing diet.  Do little things that you enjoy. Have a cup of tea, take a bubble bath, read your favorite magazine, or listen to your favorite music.  Avoid alcohol.  Ask for help with household chores, cooking, grocery shopping, or running errands as needed. Do not try to do everything.  Talk to people close to you about how you are feeling. Get support from your partner, family members, friends, or other new moms.  Try to stay positive in how you think. Think about the things you are grateful for.  Do not spend a lot of time alone.  Only take medicine as directed by your caregiver.  Keep all your postpartum appointments.  Let your caregiver know if you have any concerns. SEEK MEDICAL CARE IF: You are having a reaction or problems with your medicine. SEEK IMMEDIATE MEDICAL CARE IF:  You have suicidal feelings.  You feel you may harm the baby or someone else. Document Released: 04/24/2004 Document Revised: 10/13/2011 Document Reviewed: 05/02/2013 [facility] Patient Information 2014 [place], [place]. Postpartum Care After Cesarean Delivery After you deliver your newborn (postpartum period), the usual stay in  the hospital is 24 72 hours. If there were problems with your labor or delivery, or if you have other medical problems, you might be in the hospital longer.  While you are in the hospital, you will receive help and instructions on how to care for yourself and your newborn during the postpartum period.  While you are in the hospital:  It is normal for you to have pain or discomfort from the incision in your abdomen. Be sure to tell your nurses when you are having pain, where the pain is located, and what makes the pain worse.  If you are breastfeeding, you may feel uncomfortable contractions of your uterus for a couple of weeks. This is normal. The contractions help your uterus get back to normal size.  It is normal to have some bleeding after delivery.  For the first 1 3 days after delivery, the flow is red and the amount may be similar to a period.  It is common for the flow to start and stop.  In the first few days, you may pass some small clots. Let your nurses know if you begin to pass large clots or your flow increases.  Do not  flush blood clots down the toilet before having the nurse look at them.  During the next 3 10 days after delivery, your flow should become more watery and pink or brown-tinged in color.  Ten to fourteen days after delivery, your flow should be a small amount of yellowish-white discharge.  The amount of your flow will decrease over the first few weeks after delivery. Your flow may stop  in 6 8 weeks. Most women have had their flow stop by 12 weeks after delivery.  You should change your sanitary pads frequently.  Wash your hands thoroughly with soap and water for at least 20 seconds after changing pads, using the toilet, or before holding or feeding your newborn.  Your intravenous (IV) tubing will be removed when you are drinking enough fluids.  The urine drainage tube (urinary catheter) that was inserted before delivery may be removed within 6 8 hours after  delivery or when feeling returns to your legs. You should feel like you need to empty your bladder within the first 6 8 hours after the catheter has been removed.  In case you become weak, lightheaded, or faint, call your nurse before you get out of bed for the first time and before you take a shower for the first time.  Within the first few days after delivery, your breasts may begin to feel tender and full. This is called engorgement. Breast tenderness usually goes away within 48 72 hours after engorgement occurs. You may also notice milk leaking from your breasts. If you are not breastfeeding, do not stimulate your breasts. Breast stimulation can make your breasts produce more milk.  Spending as much time as possible with your newborn is very important. During this time, you and your newborn can feel close and get to know each other. Having your newborn stay in your room (rooming in) will help to strengthen the bond with your newborn. It will give you time to get to know your newborn and become comfortable caring for your newborn.  Your hormones change after delivery. Sometimes the hormone changes can temporarily cause you to feel sad or tearful. These feelings should not last more than a few days. If these feelings last longer than that, you should talk to your caregiver.  If desired, talk to your caregiver about methods of family planning or contraception.  Talk to your caregiver about immunizations. Your caregiver may want you to have the following immunizations before leaving the hospital:  Tetanus, diphtheria, and pertussis (Tdap) or tetanus and diphtheria (Td) immunization. It is very important that you and your family (including grandparents) or others caring for your newborn are up-to-date with the Tdap or Td immunizations. The Tdap or Td immunization can help protect your newborn from getting ill.  Rubella immunization.  Varicella (chickenpox) immunization.  Influenza immunization.  You should receive this annual immunization if you did not receive the immunization during your pregnancy. Document Released: 04/14/2012 Document Reviewed: 04/14/2012 [facility] Patient Information 2014 [place], [place].

## 2014-06-05 ENCOUNTER — Encounter (HOSPITAL_COMMUNITY): Payer: Self-pay | Admitting: Obstetrics and Gynecology

## 2014-12-18 ENCOUNTER — Encounter (HOSPITAL_BASED_OUTPATIENT_CLINIC_OR_DEPARTMENT_OTHER): Payer: Self-pay | Admitting: *Deleted

## 2014-12-18 DIAGNOSIS — D229 Melanocytic nevi, unspecified: Secondary | ICD-10-CM

## 2014-12-18 HISTORY — DX: Melanocytic nevi, unspecified: D22.9

## 2014-12-25 ENCOUNTER — Ambulatory Visit (HOSPITAL_BASED_OUTPATIENT_CLINIC_OR_DEPARTMENT_OTHER)
Admission: RE | Admit: 2014-12-25 | Discharge: 2014-12-25 | Disposition: A | Discharge status: Home / Self Care | Payer: Managed Care, Other (non HMO) | Source: Ambulatory Visit | Attending: Specialist | Admitting: Specialist

## 2014-12-25 ENCOUNTER — Ambulatory Visit (HOSPITAL_BASED_OUTPATIENT_CLINIC_OR_DEPARTMENT_OTHER): Payer: Managed Care, Other (non HMO) | Admitting: Anesthesiology

## 2014-12-25 ENCOUNTER — Encounter (HOSPITAL_BASED_OUTPATIENT_CLINIC_OR_DEPARTMENT_OTHER)
Admission: RE | Disposition: A | Discharge status: Home / Self Care | Payer: Self-pay | Source: Ambulatory Visit | Attending: Specialist

## 2014-12-25 ENCOUNTER — Encounter (HOSPITAL_BASED_OUTPATIENT_CLINIC_OR_DEPARTMENT_OTHER): Payer: Self-pay | Admitting: Anesthesiology

## 2014-12-25 DIAGNOSIS — D225 Melanocytic nevi of trunk: Secondary | ICD-10-CM | POA: Diagnosis present

## 2014-12-25 DIAGNOSIS — L821 Other seborrheic keratosis: Secondary | ICD-10-CM | POA: Insufficient documentation

## 2014-12-25 HISTORY — DX: Melanocytic nevi, unspecified: D22.9

## 2014-12-25 HISTORY — PX: NEVUS EXCISION: SHX5263

## 2014-12-25 LAB — POCT HEMOGLOBIN-HEMACUE: Hemoglobin: 14.2 g/dL (ref 12.0–15.0)

## 2014-12-25 SURGERY — EXCISION, NEVUS
Anesthesia: General | Site: Breast | Laterality: Left

## 2014-12-25 MED ORDER — CEFAZOLIN SODIUM-DEXTROSE 2-3 GM-% IV SOLR
2.0000 g | INTRAVENOUS | Status: AC
  Administered 2014-12-25: 2 g via INTRAVENOUS

## 2014-12-25 MED ORDER — FENTANYL CITRATE (PF) 100 MCG/2ML IJ SOLN
INTRAMUSCULAR | Status: DC | PRN
  Administered 2014-12-25: 100 ug via INTRAVENOUS

## 2014-12-25 MED ORDER — SODIUM CHLORIDE 0.9 % IV SOLN
INTRAVENOUS | Status: DC | PRN
  Administered 2014-12-25: 200 mL via INTRAMUSCULAR

## 2014-12-25 MED ORDER — CEFAZOLIN SODIUM-DEXTROSE 2-3 GM-% IV SOLR
INTRAVENOUS | Status: AC
  Filled 2014-12-25: qty 50

## 2014-12-25 MED ORDER — PROPOFOL 10 MG/ML IV BOLUS
INTRAVENOUS | Status: DC | PRN
  Administered 2014-12-25: 200 mg via INTRAVENOUS

## 2014-12-25 MED ORDER — DEXAMETHASONE SODIUM PHOSPHATE 4 MG/ML IJ SOLN
INTRAMUSCULAR | Status: DC | PRN
  Administered 2014-12-25: 10 mg via INTRAVENOUS

## 2014-12-25 MED ORDER — HYDROMORPHONE HCL 1 MG/ML IJ SOLN: 0.2500 mg | INTRAMUSCULAR | Status: DC | PRN

## 2014-12-25 MED ORDER — LIDOCAINE HCL (CARDIAC) 20 MG/ML IV SOLN
INTRAVENOUS | Status: DC | PRN
  Administered 2014-12-25: 60 mg via INTRAVENOUS

## 2014-12-25 MED ORDER — MIDAZOLAM HCL 2 MG/2ML IJ SOLN
INTRAMUSCULAR | Status: AC
  Filled 2014-12-25: qty 2

## 2014-12-25 MED ORDER — FENTANYL CITRATE (PF) 100 MCG/2ML IJ SOLN
INTRAMUSCULAR | Status: AC
  Filled 2014-12-25: qty 6

## 2014-12-25 MED ORDER — LACTATED RINGERS IV SOLN
INTRAVENOUS | Status: DC
  Administered 2014-12-25 (×2): via INTRAVENOUS

## 2014-12-25 MED ORDER — MIDAZOLAM HCL 5 MG/5ML IJ SOLN
INTRAMUSCULAR | Status: DC | PRN
  Administered 2014-12-25: 2 mg via INTRAVENOUS

## 2014-12-25 MED ORDER — PROMETHAZINE HCL 25 MG/ML IJ SOLN
6.2500 mg | INTRAMUSCULAR | Status: DC | PRN
Start: 2014-12-25 — End: 2014-12-25

## 2014-12-25 MED ORDER — ONDANSETRON HCL 4 MG/2ML IJ SOLN
INTRAMUSCULAR | Status: DC | PRN
  Administered 2014-12-25: 4 mg via INTRAVENOUS

## 2014-12-25 SURGICAL SUPPLY — 58 items
BAG DECANTER FOR FLEXI CONT (MISCELLANEOUS) ×2 IMPLANT
BANDAGE ELASTIC 4 VELCRO ST LF (GAUZE/BANDAGES/DRESSINGS) IMPLANT
BENZOIN TINCTURE PRP APPL 2/3 (GAUZE/BANDAGES/DRESSINGS) ×2 IMPLANT
BINDER BREAST XLRG (GAUZE/BANDAGES/DRESSINGS) ×2 IMPLANT
BLADE KNIFE PERSONA 10 (BLADE) ×2 IMPLANT
BLADE KNIFE PERSONA 15 (BLADE) ×2 IMPLANT
BLADE SURG 11 STRL SS (BLADE) ×2 IMPLANT
BNDG COHESIVE 4X5 TAN STRL (GAUZE/BANDAGES/DRESSINGS) IMPLANT
BNDG GAUZE ELAST 4 BULKY (GAUZE/BANDAGES/DRESSINGS) IMPLANT
BRIEF STRETCH FOR OB PAD LRG (UNDERPADS AND DIAPERS) ×2 IMPLANT
CANISTER SUCT 1200ML W/VALVE (MISCELLANEOUS) ×2 IMPLANT
COTTONBALL LRG STERILE PKG (GAUZE/BANDAGES/DRESSINGS) IMPLANT
COVER BACK TABLE 60X90IN (DRAPES) ×2 IMPLANT
COVER MAYO STAND STRL (DRAPES) ×2 IMPLANT
DRAPE LAPAROSCOPIC ABDOMINAL (DRAPES) ×2 IMPLANT
DRAPE LAPAROTOMY T 98X78 PEDS (DRAPES) IMPLANT
DRSG PAD ABDOMINAL 8X10 ST (GAUZE/BANDAGES/DRESSINGS) ×2 IMPLANT
ELECT REM PT RETURN 9FT ADLT (ELECTROSURGICAL) ×2
ELECTRODE REM PT RTRN 9FT ADLT (ELECTROSURGICAL) ×1 IMPLANT
GAUZE SPONGE 4X4 12PLY STRL (GAUZE/BANDAGES/DRESSINGS) ×2 IMPLANT
GAUZE SPONGE 4X4 16PLY XRAY LF (GAUZE/BANDAGES/DRESSINGS) IMPLANT
GAUZE XEROFORM 1X8 LF (GAUZE/BANDAGES/DRESSINGS) IMPLANT
GAUZE XEROFORM 5X9 LF (GAUZE/BANDAGES/DRESSINGS) ×2 IMPLANT
GLOVE BIO SURGEON STRL SZ 6.5 (GLOVE) ×2 IMPLANT
GLOVE BIOGEL M STRL SZ7.5 (GLOVE) ×2 IMPLANT
GLOVE BIOGEL PI IND STRL 8 (GLOVE) ×1 IMPLANT
GLOVE BIOGEL PI INDICATOR 8 (GLOVE) ×1
GLOVE ECLIPSE 7.0 STRL STRAW (GLOVE) ×2 IMPLANT
GOWN STRL REUS W/ TWL XL LVL3 (GOWN DISPOSABLE) ×2 IMPLANT
GOWN STRL REUS W/TWL XL LVL3 (GOWN DISPOSABLE) ×2
NEEDLE HYPO 25X1 1.5 SAFETY (NEEDLE) ×2 IMPLANT
NEEDLE SPNL 18GX3.5 QUINCKE PK (NEEDLE) IMPLANT
PACK BASIN DAY SURGERY FS (CUSTOM PROCEDURE TRAY) ×2 IMPLANT
PEN SKIN MARKING BROAD TIP (MISCELLANEOUS) ×2 IMPLANT
SHEET MEDIUM DRAPE 40X70 STRL (DRAPES) IMPLANT
SHEETING SILICONE GEL EPI DERM (MISCELLANEOUS) IMPLANT
SLEEVE SCD COMPRESS KNEE MED (MISCELLANEOUS) ×2 IMPLANT
SPONGE GAUZE 4X4 12PLY STER LF (GAUZE/BANDAGES/DRESSINGS) IMPLANT
SPONGE LAP 18X18 X RAY DECT (DISPOSABLE) ×4 IMPLANT
STAPLER VISISTAT 35W (STAPLE) IMPLANT
STOCKINETTE 4X48 STRL (DRAPES) IMPLANT
STRIP CLOSURE SKIN 1/2X4 (GAUZE/BANDAGES/DRESSINGS) ×2 IMPLANT
SUCTION FRAZIER TIP 10 FR DISP (SUCTIONS) IMPLANT
SUT ETHILON 3 0 PS 1 (SUTURE) IMPLANT
SUT MNCRL AB 3-0 PS2 18 (SUTURE) IMPLANT
SUT MON AB 2-0 CT1 36 (SUTURE) IMPLANT
SUT PROLENE 4 0 P 3 18 (SUTURE) IMPLANT
SUT PROLENE 4 0 PS 2 18 (SUTURE) IMPLANT
SYR 20CC LL (SYRINGE) IMPLANT
SYR 50ML LL SCALE MARK (SYRINGE) ×4 IMPLANT
SYR CONTROL 10ML LL (SYRINGE) ×2 IMPLANT
TAPE HYPAFIX 6X30 (GAUZE/BANDAGES/DRESSINGS) IMPLANT
TOWEL OR 17X24 6PK STRL BLUE (TOWEL DISPOSABLE) ×4 IMPLANT
TRAY DSU PREP LF (CUSTOM PROCEDURE TRAY) ×2 IMPLANT
TUBE CONNECTING 20X1/4 (TUBING) IMPLANT
UNDERPAD 30X30 (UNDERPADS AND DIAPERS) ×4 IMPLANT
VAC PENCILS W/TUBING CLEAR (MISCELLANEOUS) IMPLANT
YANKAUER SUCT BULB TIP NO VENT (SUCTIONS) ×2 IMPLANT

## 2014-12-25 NOTE — Anesthesia Postprocedure Evaluation (Signed)
  Anesthesia Post-op Note  Patient: Caitlin Hall  Procedure(s) Performed: Procedure(s): EXCISION NEVUS SEBACEOUS LEFT BREAST/PLASTIC CLOSURE (Left)  Patient Location: PACU  Anesthesia Type:General  Level of Consciousness: awake  Airway and Oxygen Therapy: Patient Spontanous Breathing  Post-op Pain: mild  Post-op Assessment: Post-op Vital signs reviewed  Post-op Vital Signs: Reviewed  Last Vitals:  Filed Vitals:   12/25/14 1210  BP:   Pulse: 75  Temp:   Resp: 16    Complications: No apparent anesthesia complications

## 2014-12-25 NOTE — Anesthesia Procedure Notes (Signed)
Procedure Name: LMA Insertion Performed by: Shanell Aden W Pre-anesthesia Checklist: Patient identified, Timeout performed, Emergency Drugs available, Suction available and Patient being monitored Patient Re-evaluated:Patient Re-evaluated prior to inductionOxygen Delivery Method: Circle system utilized Preoxygenation: Pre-oxygenation with 100% oxygen Intubation Type: IV induction Ventilation: Mask ventilation without difficulty LMA: LMA inserted LMA Size: 4.0 Tube type: Oral Number of attempts: 1 Placement Confirmation: positive ETCO2 and breath sounds checked- equal and bilateral Tube secured with: Tape Dental Injury: Teeth and Oropharynx as per pre-operative assessment      

## 2014-12-25 NOTE — Anesthesia Preprocedure Evaluation (Signed)
Anesthesia Evaluation  Patient identified by MRN, date of birth, ID band Patient awake    Reviewed: Allergy & Precautions, NPO status , Patient's Chart, lab work & pertinent test results  Airway Mallampati: II  TM Distance: >3 FB Neck ROM: Full    Dental   Pulmonary neg pulmonary ROS,  breath sounds clear to auscultation        Cardiovascular negative cardio ROS  Rhythm:Regular Rate:Normal     Neuro/Psych    GI/Hepatic negative GI ROS, Neg liver ROS,   Endo/Other    Renal/GU negative Renal ROS     Musculoskeletal   Abdominal   Peds  Hematology   Anesthesia Other Findings   Reproductive/Obstetrics                             Anesthesia Physical Anesthesia Plan  ASA: II  Anesthesia Plan: General   Post-op Pain Management:    Induction: Intravenous  Airway Management Planned: LMA  Additional Equipment:   Intra-op Plan:   Post-operative Plan: Extubation in OR  Informed Consent: I have reviewed the patients History and Physical, chart, labs and discussed the procedure including the risks, benefits and alternatives for the proposed anesthesia with the patient or authorized representative who has indicated his/her understanding and acceptance.   Dental advisory given  Plan Discussed with: CRNA and Anesthesiologist  Anesthesia Plan Comments:         Anesthesia Quick Evaluation

## 2014-12-25 NOTE — H&P (Signed)
Caitlin Hall is an 24 y.o. female.   Chief Complaint: Severe lesions under left breast  Nevus sebaceum with increased growth HPI: Increased growth of the above with darkening and thickening  Past Medical History  Diagnosis Date  . 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 Cesarean section N/A 11/13/2013    Procedure: CESAREAN SECTION;  Surgeon: Betsy Coder, MD;  Location: [facility];  Service: Obstetrics;  Laterality: N/A;  . Strabismus surgery Right 1997    History reviewed. No pertinent family history. Social History:  reports that she has never smoked. She has never used smokeless tobacco. She reports that she does not drink alcohol or use illicit drugs.  Allergies: No Known Allergies  No prescriptions prior to admission    No results found for this or any previous visit (from the past 48 hour(s)). No results found.  Review of Systems  Constitutional: Negative.   HENT: Negative.   Eyes: Negative.   Respiratory: Negative.   Cardiovascular: Negative.   Gastrointestinal: Negative.   Genitourinary: Negative.   Musculoskeletal: Negative.   Skin: Positive for rash.  Neurological: Negative.   Endo/Heme/Allergies: Negative.   Psychiatric/Behavioral: Negative.     Height 5\' 6"  (1.676 m), weight 89.359 kg (197 lb), last menstrual period 12/08/2014, not currently breastfeeding. 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

## 2014-12-25 NOTE — Discharge Instructions (Signed)
Activity (include date of return to work if known) As tolerated: NO showers NO driving No heavy activities  Diet:regular No restrictions:  Wound Care: Keep dressing clean & dry  Do not change dressings For Abdominoplasties wear abdominal binder Special Instructions: Do not raise arms up Continue to empty, recharge, & record drainage from J-P drains &/or Hemovacs 2-3 times a day, as needed. Call Doctor if any unusual problems occur such as pain, excessive Bleeding, unrelieved Nausea/vomiting, Fever &/or chills When lying down, keep head elevated on 2-3 pillows or back-rest For Addominoplasties the Jack-knife position Follow-up appointment: Please call the office.  The patient received discharge instruction from:___________________________________________   Patient signature ________________________________________ / Date___________    Signature of individual providing instructions/ Date________________                Post Anesthesia Home Care Instructions  Activity: Get plenty of rest for the remainder of the day. A responsible adult should stay with you for 24 hours following the procedure.  For the next 24 hours, DO NOT: -Drive a car -Paediatric nurse -Drink alcoholic beverages -Take any medication unless instructed by your physician -Make any legal decisions or sign important papers.  Meals: Start with liquid foods such as gelatin or soup. Progress to regular foods as tolerated. Avoid greasy, spicy, heavy foods. If nausea and/or vomiting occur, drink only clear liquids until the nausea and/or vomiting subsides. Call your physician if vomiting continues.  Special Instructions/Symptoms: Your throat may feel dry or sore from the anesthesia or the breathing tube placed in your throat during surgery. If this causes discomfort, gargle with warm salt water. The discomfort should disappear within 24 hours.  If you had a scopolamine patch placed behind your ear for the  management of post- operative nausea and/or vomiting:  1. The medication in the patch is effective for 72 hours, after which it should be removed.  Wrap patch in a tissue and discard in the trash. Wash hands thoroughly with soap and water. 2. You may remove the patch earlier than 72 hours if you experience unpleasant side effects which may include dry mouth, dizziness or visual disturbances. 3. Avoid touching the patch. Wash your hands with soap and water after contact with the patch.   Call your surgeon if you experience:   1.  Fever over 101.0. 2.  Inability to urinate. 3.  Nausea and/or vomiting. 4.  Extreme swelling or bruising at the surgical site. 5.  Continued bleeding from the incision. 6.  Increased pain, redness or drainage from the incision. 7.  Problems related to your pain medication. 8. Any change in color, movement and/or sensation 9. Any problems and/or concerns

## 2014-12-25 NOTE — Brief Op Note (Signed)
12/25/2014  11:40 AM  PATIENT:  Caitlin Hall  24 y.o. female  PRE-OPERATIVE DIAGNOSIS:  NEVUS SEBACEOUS LEFT BREAST  POST-OPERATIVE DIAGNOSIS:  NEVUS SEBACEOUS LEFT BREAST  PROCEDURE:  Procedure(s): EXCISION NEVUS SEBACEOUS LEFT BREAST/PLASTIC CLOSURE (Left)  SURGEON:  Surgeon(s) and Role:    * Cristine Polio, MD - Primary  PHYSICIAN ASSISTANT:   ASSISTANTS: none   ANESTHESIA:   general  EBL:  Total I/O In: 1300 [I.V.:1300] Out: -   BLOOD ADMINISTERED:none  DRAINS: none   LOCAL MEDICATIONS USED:  LIDOCAINE   SPECIMEN:  Excision  DISPOSITION OF SPECIMEN:  PATHOLOGY  COUNTS:  YES  TOURNIQUET:  * No tourniquets in log *  DICTATION: .Other Dictation: Dictation Number [id number]  PLAN OF CARE: Discharge to home after PACU  PATIENT DISPOSITION:  PACU - hemodynamically stable.   Delay start of Pharmacological VTE agent (>24hrs) due to surgical blood loss or risk of bleeding: yes

## 2014-12-25 NOTE — Transfer of Care (Signed)
Immediate Anesthesia Transfer of Care Note  Patient: Caitlin Hall  Procedure(s) Performed: Procedure(s): EXCISION NEVUS SEBACEOUS LEFT BREAST/PLASTIC CLOSURE (Left)  Patient Location: PACU  Anesthesia Type:General  Level of Consciousness: awake and sedated  Airway & Oxygen Therapy: Patient Spontanous Breathing and Patient connected to face mask oxygen  Post-op Assessment: Report given to RN and Post -op Vital signs reviewed and stable  Post vital signs: Reviewed and stable  Last Vitals:  Filed Vitals:   12/25/14 1004  BP: 120/75  Pulse: 76  Temp: 36.8 C  Resp: 18    Complications: No apparent anesthesia complications

## 2014-12-26 ENCOUNTER — Encounter (HOSPITAL_BASED_OUTPATIENT_CLINIC_OR_DEPARTMENT_OTHER): Payer: Self-pay | Admitting: Specialist

## 2014-12-26 NOTE — Op Note (Signed)
NAME:  Caitlin Hall, Caitlin Hall NO.:  [id number]  MEDICAL RECORD NO.:  [id number]  LOCATION:                                FACILITY:  [facility]  PHYSICIAN:  Berneta Sages L. 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  PROCEDURE:  Planned wide excision of whole sebaceous nevus of the left chest breast area, plaster reconstruction, and closure.  ANESTHESIA:  General.  DESCRIPTION OF PROCEDURE:  The patient underwent general anesthesia, intubated orally.  Marker pen was used to outline the whole area of the nevus sebaceous of extensive across the whole left inframammary fold and at the anterior chest area.  0.5% Xylocaine with epinephrine was injected locally to the area for hemostasis and decreased bleeding, a total of 100 mL.  Wide excision was made of the area using the Bovie on cutting with proper margins throughout the whole entire area. Dissection then carried down through subcutaneous tissue and then the mass removed subcutaneously throughout the whole course.  Hemostasis was maintained with the Bovie and then coagulation.  Next, the superior and inferior flaps were freed up extensively approximately 6.5 cm.  After proper hemostasis, the flaps were then rotated and stayed with deep sutures of 2-0 Monocryl x2 levels and then a running subcuticular stitch of 3-0 Monocryl throughout the whole area.  The wounds were cleansed. Steri-Strips and soft dressing applied all the areas.  She withstood the procedures very well and was taken to recovery in excellent condition.  ESTIMATED BLOOD LOSS:  Less than 75 mL.  COMPLICATIONS:  None.     Odella Aquas. Towanda Malkin,  M.D.     Elie Confer  D:  12/25/2014  T:  12/25/2014  Job:  [id number]

## 2017-12-30 ENCOUNTER — Other Ambulatory Visit: Payer: Self-pay | Admitting: Obstetrics and Gynecology

## 2018-01-15 NOTE — Telephone Encounter (Signed)
Preadmission screen
